# Patient Record
Sex: Female | Born: 1995
Health system: Southern US, Community
[De-identification: ages and names within clinical notes are randomized; demographics above are authoritative.]

## PROBLEM LIST (undated history)

## (undated) DIAGNOSIS — F909 Attention-deficit hyperactivity disorder, unspecified type: Secondary | ICD-10-CM

## (undated) DIAGNOSIS — K219 Gastro-esophageal reflux disease without esophagitis: Secondary | ICD-10-CM

## (undated) DIAGNOSIS — Z8619 Personal history of other infectious and parasitic diseases: Secondary | ICD-10-CM

## (undated) HISTORY — PX: MYRINGOTOMY WITH TUBE PLACEMENT: SHX5663

## (undated) HISTORY — DX: Personal history of other infectious and parasitic diseases: Z86.19

## (undated) HISTORY — PX: NO PAST SURGERIES: SHX2092

---
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
## 2014-01-26 ENCOUNTER — Encounter: Payer: Self-pay | Admitting: Internal Medicine

## 2014-01-26 ENCOUNTER — Ambulatory Visit (INDEPENDENT_AMBULATORY_CARE_PROVIDER_SITE_OTHER): Payer: 59 | Admitting: Internal Medicine

## 2014-01-26 ENCOUNTER — Encounter: Payer: Self-pay | Admitting: *Deleted

## 2014-01-26 ENCOUNTER — Other Ambulatory Visit (INDEPENDENT_AMBULATORY_CARE_PROVIDER_SITE_OTHER): Payer: 59

## 2014-01-26 VITALS — BP 112/62 | HR 91 | Temp 98.6°F | Ht 63.0 in | Wt 174.0 lb

## 2014-01-26 DIAGNOSIS — Z0000 Encounter for general adult medical examination without abnormal findings: Secondary | ICD-10-CM

## 2014-01-26 DIAGNOSIS — F988 Other specified behavioral and emotional disorders with onset usually occurring in childhood and adolescence: Secondary | ICD-10-CM

## 2014-01-26 DIAGNOSIS — E669 Obesity, unspecified: Secondary | ICD-10-CM

## 2014-01-26 LAB — CBC WITH DIFFERENTIAL/PLATELET
BASOS PCT: 0.3 % (ref 0.0–3.0)
Basophils Absolute: 0 10*3/uL (ref 0.0–0.1)
EOS ABS: 0 10*3/uL (ref 0.0–0.7)
EOS PCT: 0.6 % (ref 0.0–5.0)
HCT: 42 % (ref 36.0–49.0)
HEMOGLOBIN: 14.2 g/dL (ref 12.0–16.0)
LYMPHS PCT: 27.6 % (ref 24.0–48.0)
Lymphs Abs: 2.2 10*3/uL (ref 0.7–4.0)
MCHC: 33.8 g/dL (ref 31.0–37.0)
MCV: 86.9 fl (ref 78.0–98.0)
MONOS PCT: 7.6 % (ref 3.0–12.0)
Monocytes Absolute: 0.6 10*3/uL (ref 0.1–1.0)
NEUTROS ABS: 5.2 10*3/uL (ref 1.4–7.7)
Neutrophils Relative %: 63.9 % (ref 43.0–71.0)
Platelets: 309 10*3/uL (ref 150.0–575.0)
RBC: 4.83 Mil/uL (ref 3.80–5.70)
RDW: 12.8 % (ref 11.4–15.5)
WBC: 8.1 10*3/uL (ref 4.5–13.5)

## 2014-01-26 LAB — LIPID PANEL
CHOL/HDL RATIO: 3
Cholesterol: 169 mg/dL (ref 0–200)
HDL: 62 mg/dL (ref >39.00)
LDL Cholesterol: 92 mg/dL (ref 0–99)
NONHDL: 107
Triglycerides: 73 mg/dL (ref 0.0–149.0)
VLDL: 14.6 mg/dL (ref 0.0–40.0)

## 2014-01-26 LAB — BASIC METABOLIC PANEL
BUN: 15 mg/dL (ref 6–23)
CALCIUM: 9.6 mg/dL (ref 8.4–10.5)
CO2: 26 meq/L (ref 19–32)
Chloride: 103 mEq/L (ref 96–112)
Creatinine, Ser: 0.7 mg/dL (ref 0.4–1.2)
GFR: 110.82 mL/min (ref >60.00)
Glucose, Bld: 94 mg/dL (ref 70–99)
Potassium: 4.4 mEq/L (ref 3.5–5.1)
SODIUM: 137 meq/L (ref 135–145)

## 2014-01-26 LAB — TSH: TSH: 2.02 u[IU]/mL (ref 0.40–5.00)

## 2014-01-26 LAB — HEPATIC FUNCTION PANEL
ALBUMIN: 4.2 g/dL (ref 3.5–5.2)
ALK PHOS: 47 U/L (ref 47–119)
ALT: 15 U/L (ref 0–35)
AST: 21 U/L (ref 0–37)
BILIRUBIN DIRECT: 0 mg/dL (ref 0.0–0.3)
TOTAL PROTEIN: 7.5 g/dL (ref 6.0–8.3)
Total Bilirubin: 0.5 mg/dL (ref 0.2–1.2)

## 2014-01-26 MED ORDER — METHYLPHENIDATE HCL 10 MG PO TABS: 15.0000 mg | ORAL_TABLET | ORAL | Status: DC

## 2014-01-26 MED ORDER — METHYLPHENIDATE HCL ER (OSM) 36 MG PO TBCR: 72.0000 mg | EXTENDED_RELEASE_TABLET | ORAL | Status: DC

## 2014-01-26 NOTE — Patient Instructions (Addendum)
It was good to see you today.  Health Maintenance reviewed - all recommended immunizations and age-appropriate screenings are up-to-date.  We have reviewed your prior records including labs and tests today  Test(s) ordered today. Your results will be released to [facility] (or called to you) after review, usually within 72hours after test completion. If any changes need to be made, you will be notified at that same time.  Medications reviewed and updated, no changes recommended at this time. Refill on medication(s) as discussed today.  Work on lifestyle changes as discussed (low fat, low carb, increased protein diet; improved exercise efforts; weight loss) to control sugar, blood pressure and cholesterol levels and/or reduce risk of developing other medical problems. Look into [email or web address].[email or web address] or other type of food journal to assist you in this process.  Please schedule followup in 12 months, call sooner if problems.   Health Maintenance, Female A healthy lifestyle and preventative care can promote health and wellness.  Maintain regular health, dental, and eye exams.  Eat a healthy diet. Foods like vegetables, fruits, whole grains, low-fat dairy products, and lean protein foods contain the nutrients you need without too many calories. Decrease your intake of foods high in solid fats, added sugars, and salt. Get information about a proper diet from your caregiver, if necessary.  Regular physical exercise is one of the most important things you can do for your health. Most adults should get at least 150 minutes of moderate-intensity exercise (any activity that increases your heart rate and causes you to sweat) each week. In addition, most adults need muscle-strengthening exercises on 2 or more days a week.   Maintain a healthy weight. The body mass index (BMI) is a screening tool to identify possible weight problems. It provides an estimate of body fat based on height and weight. Your caregiver  can help determine your BMI, and can help you achieve or maintain a healthy weight. For adults 20 years and older:  A BMI below 18.5 is considered underweight.  A BMI of 18.5 to 24.9 is normal.  A BMI of 25 to 29.9 is considered overweight.  A BMI of 30 and above is considered obese.  Maintain normal blood lipids and cholesterol by exercising and minimizing your intake of saturated fat. Eat a balanced diet with plenty of fruits and vegetables. Blood tests for lipids and cholesterol should begin at age 4 and be repeated every 5 years. If your lipid or cholesterol levels are high, you are over 50, or you are a high risk for heart disease, you may need your cholesterol levels checked more frequently.Ongoing high lipid and cholesterol levels should be treated with medicines if diet and exercise are not effective.  If you smoke, find out from your caregiver how to quit. If you do not use tobacco, do not start.  Lung cancer screening is recommended for adults aged 49 80 years who are at high risk for developing lung cancer because of a history of smoking. Yearly low-dose computed tomography (CT) is recommended for people who have at least a 30-pack-year history of smoking and are a current smoker or have quit within the past 15 years. A pack year of smoking is smoking an average of 1 pack of cigarettes a day for 1 year (for example: 1 pack a day for 30 years or 2 packs a day for 15 years). Yearly screening should continue until the smoker has stopped smoking for at least 15 years. Yearly screening should also be stopped for people  who develop a health problem that would prevent them from having lung cancer treatment.  If you are pregnant, do not drink alcohol. If you are breastfeeding, be very cautious about drinking alcohol. If you are not pregnant and choose to drink alcohol, do not exceed 1 drink per day. One drink is considered to be 12 ounces (355 mL) of beer, 5 ounces (148 mL) of wine, or 1.5  ounces (44 mL) of liquor.  Avoid use of street drugs. Do not share needles with anyone. Ask for help if you need support or instructions about stopping the use of drugs.  High blood pressure causes heart disease and increases the risk of stroke. Blood pressure should be checked at least every 1 to 2 years. Ongoing high blood pressure should be treated with medicines, if weight loss and exercise are not effective.  If you are 73 to 19 years old, ask your caregiver if you should take aspirin to prevent strokes.  Diabetes screening involves taking a blood sample to check your fasting blood sugar level. This should be done once every 3 years, after age 74, if you are within normal weight and without risk factors for diabetes. Testing should be considered at a younger age or be carried out more frequently if you are overweight and have at least 1 risk factor for diabetes.  Breast cancer screening is essential preventative care for women. You should practice "breast self-awareness." This means understanding the normal appearance and feel of your breasts and may include breast self-examination. Any changes detected, no matter how small, should be reported to a caregiver. Women in their 42s and 30s should have a clinical breast exam (CBE) by a caregiver as part of a regular health exam every 1 to 3 years. After age 28, women should have a CBE every year. Starting at age 11, women should consider having a mammogram (breast X-ray) every year. Women who have a family history of breast cancer should talk to their caregiver about genetic screening. Women at a high risk of breast cancer should talk to their caregiver about having an MRI and a mammogram every year.  Breast cancer gene (BRCA)-related cancer risk assessment is recommended for women who have family members with BRCA-related cancers. BRCA-related cancers include breast, ovarian, tubal, and peritoneal cancers. Having family members with these cancers may be  associated with an increased risk for harmful changes (mutations) in the breast cancer genes BRCA1 and BRCA2. Results of the assessment will determine the need for genetic counseling and BRCA1 and BRCA2 testing.  The Pap test is a screening test for cervical cancer. Women should have a Pap test starting at age 108. Between ages 81 and 46, Pap tests should be repeated every 2 years. Beginning at age 90, you should have a Pap test every 3 years as long as the past 3 Pap tests have been normal. If you had a hysterectomy for a problem that was not cancer or a condition that could lead to cancer, then you no longer need Pap tests. If you are between ages 38 and 21, and you have had normal Pap tests going back 10 years, you no longer need Pap tests. If you have had past treatment for cervical cancer or a condition that could lead to cancer, you need Pap tests and screening for cancer for at least 20 years after your treatment. If Pap tests have been discontinued, risk factors (such as a new sexual partner) need to be reassessed to determine if screening  should be resumed. Some women have medical problems that increase the chance of getting cervical cancer. In these cases, your caregiver may recommend more frequent screening and Pap tests.  The human papillomavirus (HPV) test is an additional test that may be used for cervical cancer screening. The HPV test looks for the virus that can cause the cell changes on the cervix. The cells collected during the Pap test can be tested for HPV. The HPV test could be used to screen women aged 78 years and older, and should be used in women of any age who have unclear Pap test results. After the age of 8, women should have HPV testing at the same frequency as a Pap test.  Colorectal cancer can be detected and often prevented. Most routine colorectal cancer screening begins at the age of 18 and continues through age 78. However, your caregiver may recommend screening at an  earlier age if you have risk factors for colon cancer. On a yearly basis, your caregiver may provide home test kits to check for hidden blood in the stool. Use of a small camera at the end of a tube, to directly examine the colon (sigmoidoscopy or colonoscopy), can detect the earliest forms of colorectal cancer. Talk to your caregiver about this at age 14, when routine screening begins. Direct examination of the colon should be repeated every 5 to 10 years through age 67, unless early forms of pre-cancerous polyps or small growths are found.  Hepatitis C blood testing is recommended for all people born from [birth year] through 1966 and any individual with known risks for hepatitis C.  Practice safe sex. Use condoms and avoid high-risk sexual practices to reduce the spread of sexually transmitted infections (STIs). Sexually active women aged 51 and younger should be checked for Chlamydia, which is a common sexually transmitted infection. Older women with new or multiple partners should also be tested for Chlamydia. Testing for other STIs is recommended if you are sexually active and at increased risk.  Osteoporosis is a disease in which the bones lose minerals and strength with aging. This can result in serious bone fractures. The risk of osteoporosis can be identified using a bone density scan. Women ages 42 and over and women at risk for fractures or osteoporosis should discuss screening with their caregivers. Ask your caregiver whether you should be taking a calcium supplement or vitamin D to reduce the rate of osteoporosis.  Menopause can be associated with physical symptoms and risks. Hormone replacement therapy is available to decrease symptoms and risks. You should talk to your caregiver about whether hormone replacement therapy is right for you.  Use sunscreen. Apply sunscreen liberally and repeatedly throughout the day. You should seek shade when your shadow is shorter than you. Protect yourself by  wearing long sleeves, pants, a wide-brimmed hat, and sunglasses year round, whenever you are outdoors.  Notify your caregiver of new moles or changes in moles, especially if there is a change in shape or color. Also notify your caregiver if a mole is larger than the size of a pencil eraser.  Stay current with your immunizations. Document Released: 02/19/2011 Document Revised: 12/01/2012 Document Reviewed: 02/19/2011 [facility] Patient Information 2014 [place]. Exercise to Lose Weight Exercise and a healthy diet may help you lose weight. Your doctor may suggest specific exercises. EXERCISE IDEAS AND TIPS  Choose low-cost things you enjoy doing, such as walking, bicycling, or exercising to workout videos.  Take stairs instead of the elevator.  Walk during  your lunch break.  Park your car further away from work or school.  Go to a gym or an exercise class.  Start with 5 to 10 minutes of exercise each day. Build up to 30 minutes of exercise 4 to 6 days a week.  Wear shoes with good support and comfortable clothes.  Stretch before and after working out.  Work out until you breathe harder and your heart beats faster.  Drink extra water when you exercise.  Do not do so much that you hurt yourself, feel dizzy, or get very short of breath. Exercises that burn about 150 calories:  Running 1  miles in 15 minutes.  Playing volleyball for 45 to 60 minutes.  Washing and waxing a car for 45 to 60 minutes.  Playing touch football for 45 minutes.  Walking 1  miles in 35 minutes.  Pushing a stroller 1  miles in 30 minutes.  Playing basketball for 30 minutes.  Raking leaves for 30 minutes.  Bicycling 5 miles in 30 minutes.  Walking 2 miles in 30 minutes.  Dancing for 30 minutes.  Shoveling snow for 15 minutes.  Swimming laps for 20 minutes.  Walking up stairs for 15 minutes.  Bicycling 4 miles in 15 minutes.  Gardening for 30 to 45 minutes.  Jumping rope for 15  minutes.  Washing windows or floors for 45 to 60 minutes. Document Released: 09/08/2010 Document Revised: 10/29/2011 Document Reviewed: 09/08/2010 [facility] Patient Information 2014 [place], [place].

## 2014-01-26 NOTE — Progress Notes (Signed)
Subjective:    Patient ID: Sandra ColeMadison N Dawson, female    DOB: 1996/03/23, 18 y.o.   MRN: [id number]  HPI  patient is here today for annual physical. Patient feels well and has no complaints.  Also reviewed chronic medical issues and interval medical events  Past Medical History  Diagnosis Date  . ADHD (attention deficit hyperactivity disorder)   . History of chicken pox    Family History  Problem Relation Age of Onset  . Cancer Other     family hx colon & lung cancer  . Hyperlipidemia Father    History  Substance Use Topics  . Smoking status: Never Smoker   . Smokeless tobacco: Not on file  . Alcohol Use: No    Review of Systems  Constitutional: Positive for unexpected weight change (>20# in past 11 mo (freshman year)). Negative for fatigue.  Respiratory: Negative for cough, shortness of breath and wheezing.   Cardiovascular: Negative for chest pain, palpitations and leg swelling.  Gastrointestinal: Negative for nausea, abdominal pain and diarrhea.  Neurological: Negative for dizziness, weakness, light-headedness and headaches.  Psychiatric/Behavioral: Negative for dysphoric mood. The patient is not nervous/anxious.   All other systems reviewed and are negative.      Objective:   Physical Exam  BP 112/62  Pulse 91  Temp(Src) 98.6 F (37 C) (Oral)  Ht 5\' 3"  (1.6 m)  Wt 174 lb (78.926 kg)  BMI 30.83 kg/m2  SpO2 98% Wt Readings from Last 3 Encounters:  01/26/14 174 lb (78.926 kg) (93%*, Z = 1.51)  03/17/13 152 lb 6.4 oz (69.128 kg) (85%*, Z = 1.06)   * Growth percentiles are based on CDC 2-20 Years data.   Constitutional: She is obese, appears well-developed and well-nourished. No distress. sister at side HENT: Head: Normocephalic and atraumatic. Ears: B TMs ok, no erythema or effusion; Nose: Nose normal. Mouth/Throat: Oropharynx is clear and moist. No oropharyngeal exudate.  Eyes: Conjunctivae and EOM are normal. Pupils are equal, round, and reactive to light.  No scleral icterus.  Neck: Normal range of motion. Neck supple. No JVD present. No thyromegaly present.  Cardiovascular: Normal rate, regular rhythm and normal heart sounds.  No murmur heard. No BLE edema. Pulmonary/Chest: Effort normal and breath sounds normal. No respiratory distress. She has no wheezes.  Abdominal: Soft. Bowel sounds are normal. She exhibits no distension. There is no tenderness. no masses GU: defer Musculoskeletal: Normal range of motion, no joint effusions. No gross deformities Neurological: She is alert and oriented to person, place, and time. No cranial nerve deficit. Coordination, balance, strength, speech and gait are normal.  Skin: Skin is warm and dry. No rash noted. No erythema.  Psychiatric: She has a normal mood and affect. Her behavior is normal. Judgment and thought content normal.    No results found for this basename: WBC,  HGB,  HCT,  PLT,  GLUCOSE,  CHOL,  TRIG,  HDL,  LDLDIRECT,  LDLCALC,  ALT,  AST,  NA,  K,  CL,  CREATININE,  BUN,  CO2,  TSH,  PSA,  INR,  GLUF,  HGBA1C,  MICROALBUR    Dg Chest 2 View  05/19/2012   *RADIOLOGY REPORT*  Clinical Data: Fever, cough, wheezing.  CHEST - 2 VIEW  Comparison: None.  Findings: There is a consolidative pneumonia in the posterior aspect of the right lower lobe.  Left lung is clear.  Heart size and vascularity are normal.  No effusions.  No osseous abnormality.  IMPRESSION: Right lower lobe  pneumonia.   Original Report Authenticated By: Gwynn Burly, M.D.       Assessment & Plan:   CPX/v70.0 - Patient has been counseled on age-appropriate routine health concerns for screening and prevention. These are reviewed and up-to-date. Immunizations are up-to-date or declined. Labs ordered and reviewed.  Problem List Items Addressed This Visit   ADD (attention deficit disorder)     On ritalin and concerta for years Stable, controlled meds to be managed by student health while at school in [place]  UDS per protocol  with [facility] today    Obese      Wt Readings from Last 3 Encounters:  01/26/14 174 lb (78.926 kg) (93%*, Z = 1.51)  03/17/13 152 lb 6.4 oz (69.128 kg) (85%*, Z = 1.06)   * Growth percentiles are based on CDC 2-20 Years data.  weight trends reviewed Check labs to rule out metabolic issue The patient is asked to make an attempt to improve diet and exercise patterns to aid in medical management of this problem.     Relevant Medications      methylphenidate (RITALIN) tablet      methylphenidate (CONCERTA) CR tablet    Other Visit Diagnoses   Routine general medical examination at a health care facility    -  Primary    Relevant Orders       Basic metabolic panel       CBC with Differential       Hepatic function panel       Lipid panel       TSH

## 2014-01-26 NOTE — Assessment & Plan Note (Signed)
Wt Readings from Last 3 Encounters:  01/26/14 174 lb (78.926 kg) (93%*, Z = 1.51)  03/17/13 152 lb 6.4 oz (69.128 kg) (85%*, Z = 1.06)   * Growth percentiles are based on CDC 2-20 Years data.  weight trends reviewed Check labs to rule out metabolic issue The patient is asked to make an attempt to improve diet and exercise patterns to aid in medical management of this problem.

## 2014-01-26 NOTE — Assessment & Plan Note (Signed)
On ritalin and concerta for years Stable, controlled meds to be managed by student health while at school in [place]  UDS per protocol with [facility] today

## 2014-01-26 NOTE — Progress Notes (Signed)
Pre visit review using our clinic review tool, if applicable. No additional management support is needed unless otherwise documented below in the visit note. 

## 2014-02-03 ENCOUNTER — Encounter: Payer: Self-pay | Admitting: Internal Medicine

## 2014-02-07 ENCOUNTER — Encounter: Payer: Self-pay | Admitting: Internal Medicine

## 2014-02-07 DIAGNOSIS — R635 Abnormal weight gain: Secondary | ICD-10-CM

## 2014-02-07 DIAGNOSIS — E669 Obesity, unspecified: Secondary | ICD-10-CM

## 2014-02-23 NOTE — Addendum Note (Signed)
Addended by: Rene PaciLESCHBER, Baylen Dea A on: 02/23/2014 11:06 AM   Modules accepted: Orders

## 2014-03-12 ENCOUNTER — Ambulatory Visit (INDEPENDENT_AMBULATORY_CARE_PROVIDER_SITE_OTHER): Payer: 59 | Admitting: Internal Medicine

## 2014-03-12 ENCOUNTER — Encounter: Payer: Self-pay | Admitting: Internal Medicine

## 2014-03-12 VITALS — BP 102/82 | HR 72 | Temp 98.6°F | Resp 16 | Wt 173.0 lb

## 2014-03-12 DIAGNOSIS — J019 Acute sinusitis, unspecified: Secondary | ICD-10-CM | POA: Insufficient documentation

## 2014-03-12 DIAGNOSIS — J018 Other acute sinusitis: Secondary | ICD-10-CM

## 2014-03-12 MED ORDER — AZITHROMYCIN 250 MG PO TABS: ORAL_TABLET | ORAL | Status: DC

## 2014-03-12 NOTE — Progress Notes (Signed)
Pre visit review using our clinic review tool, if applicable. No additional management support is needed unless otherwise documented below in the visit note. 

## 2014-03-12 NOTE — Progress Notes (Signed)
   Subjective:     URI  This is a new problem. The current episode started in the past 7 days. The problem has been gradually worsening. The maximum temperature recorded prior to her arrival was 100 - 100.9 F. Associated symptoms include congestion and coughing. She has tried acetaminophen and decongestant for the symptoms. The treatment provided no relief.      Review of Systems  HENT: Positive for congestion.   Respiratory: Positive for cough.   purulent mucus     Objective:   Physical Exam  Constitutional: No distress.  HENT:  Mouth/Throat: No oropharyngeal exudate.  eryth throat  Eyes: Right eye exhibits no discharge.  Neck: No thyromegaly present.  Cardiovascular: Normal rate.   Pulmonary/Chest: She has no wheezes. She has no rales. She exhibits no tenderness.          Assessment & Plan:

## 2014-03-12 NOTE — Assessment & Plan Note (Signed)
Zpac OTC meds 

## 2014-03-16 ENCOUNTER — Ambulatory Visit (INDEPENDENT_AMBULATORY_CARE_PROVIDER_SITE_OTHER): Payer: 59 | Admitting: Internal Medicine

## 2014-03-16 ENCOUNTER — Encounter: Payer: Self-pay | Admitting: Internal Medicine

## 2014-03-16 VITALS — BP 110/80 | HR 87 | Temp 98.8°F | Wt 177.0 lb

## 2014-03-16 DIAGNOSIS — R059 Cough, unspecified: Secondary | ICD-10-CM

## 2014-03-16 DIAGNOSIS — R05 Cough: Secondary | ICD-10-CM

## 2014-03-16 DIAGNOSIS — J018 Other acute sinusitis: Secondary | ICD-10-CM

## 2014-03-16 MED ORDER — BENZONATATE 200 MG PO CAPS: 200.0000 mg | ORAL_CAPSULE | Freq: Three times a day (TID) | ORAL | Status: DC | PRN

## 2014-03-16 MED ORDER — AMOXICILLIN 500 MG PO CAPS: 500.0000 mg | ORAL_CAPSULE | Freq: Three times a day (TID) | ORAL | Status: DC

## 2014-03-16 NOTE — Progress Notes (Signed)
   Subjective:    Patient ID: Sandra Dawson, female    DOB: Feb 04, 1996, 18 y.o.   MRN: [id number]  HPI Pt's symptoms began 03/09/14 as cough and congestion. She was seen in clinic 03/12/14 and given a Zpack for acute sinusitis. Today is her last day of the Zpack.  Pt continues to cough and have nasal and head congestion. Cough is nonproductive. There is some SOB associated. The cough is constant throughout the day. The patient has noticed PND and a sore throat. She has constant dull headache at the base of occipitis. She reports a Tmax of 100.6 since she has been on the Zpack. The pt thinks her symptoms may have improved over the weekend after starting the Zpack bt states that today all of her symptoms are still present and have worsened. She has also tried mucinex, cough drops and nasal decongestants with little relief.   Her boyfriend had a URI recently. She has no allergies.   Review of Systems  HENT: Negative for ear discharge, ear pain, rhinorrhea, sinus pressure and sneezing.   Eyes: Negative for itching.  Respiratory: Negative for wheezing.   Allergic/Immunologic: Negative for environmental allergies.       Objective:   Physical Exam        Assessment & Plan:

## 2014-03-16 NOTE — Progress Notes (Signed)
Pre visit review using our clinic review tool, if applicable. No additional management support is needed unless otherwise documented below in the visit note. 

## 2014-03-16 NOTE — Patient Instructions (Signed)
Plain Mucinex (NOT D) for thick secretions ;force NON dairy fluids .   Nasal cleansing in the shower as discussed with lather of mild shampoo.After 10 seconds wash off lather while  exhaling through nostrils. Make sure that all residual soap is removed to prevent irritation.  Flonase OR Nasacort AQ 1 spray in each nostril twice a day as needed. Use the "crossover" technique into opposite nostril spraying toward opposite ear @ 45 degree angle, not straight up into nostril.  Use a Neti pot daily only  as needed for significant sinus congestion; going from open side to congested side . Plain Allegra (NOT D )  160 daily , Loratidine 10 mg , OR Zyrtec 10 mg @ bedtime  as needed for itchy eyes & sneezing. If loose stools occur please take a probiotic , Florastor OR Align, every day until the bowels are normal. This will replace the normal bacteria which  are necessary for formation of normal stool and processing of food.

## 2014-03-16 NOTE — Progress Notes (Signed)
   Subjective:    Patient ID: Sandra Dawson, female    DOB: 11-23-1995, 18 y.o.   MRN: [id number]  HPI   Symptoms began 03/09/14 as cough and head congestion. She was given a Z-Pak for acute sinusitis. Today is last day of that prescription.  She continues to cough and have nasal congestion with postnasal drainage. The cough is nonproductive and associated with some shortness of breath. The cough is constant throughout the day. She has noted a sore throat in  context of postnasal drainage.  She's had some headache at the base of the occiput.  On the Z-Pak she did have a temp maximum of 100.6.  She feels that her symptoms may have improved some but the upper respiratory tract symptoms are still present. She does use Mucinex, cough drops, nasal decongestants with little relief.  Her boyfriend had an upper respiratory tract infection recently    Review of Systems Frontal headache, facial pain , nasal purulence, dental pain,  otic pain or otic discharge denied @ present. No fever , chills or sweats.     Objective:   Physical Exam General appearance:good health ;well nourished; no acute distress or increased work of breathing is present.  No  lymphadenopathy about the head, neck, or axilla noted.   Eyes: No conjunctival inflammation or lid edema is present.   Ears:  External ear exam shows no significant lesions or deformities.  Otoscopic examination reveals clear canals, tympanic membranes are intact bilaterally without bulging, retraction, inflammation or discharge.  Nose:  External nasal examination shows no deformity or inflammation. Nasal mucosa are pink and moist without lesions or exudates. No septal dislocation or deviation.No obstruction to airflow.   Oral exam: Dental hygiene is good; lips and gums are healthy appearing.There is no oropharyngeal erythema or exudate noted.   Neck:  No deformities, thyromegaly, masses, or tenderness noted.     Heart:  Normal rate and regular  rhythm. S1 and S2 normal without gallop, murmur, click, rub or other extra sounds.   Lungs:Chest clear to auscultation; no wheezes, rhonchi,rales ,or rubs present.No increased work of breathing.    Extremities:  No cyanosis, edema, or clubbing  noted    Skin: Warm & dry          Assessment & Plan:  #1 rhinosinusitis without significant bronchitis  Plan: Nasal hygiene interventions discussed. See prescription medications

## 2014-03-22 ENCOUNTER — Ambulatory Visit: Payer: 59 | Admitting: Internal Medicine

## 2014-04-01 ENCOUNTER — Encounter: Payer: Self-pay | Admitting: *Deleted

## 2014-04-01 ENCOUNTER — Encounter: Payer: 59 | Attending: Internal Medicine | Admitting: *Deleted

## 2014-04-01 DIAGNOSIS — Z6831 Body mass index (BMI) 31.0-31.9, adult: Secondary | ICD-10-CM | POA: Diagnosis not present

## 2014-04-01 DIAGNOSIS — Z713 Dietary counseling and surveillance: Secondary | ICD-10-CM | POA: Insufficient documentation

## 2014-04-01 DIAGNOSIS — E669 Obesity, unspecified: Secondary | ICD-10-CM | POA: Diagnosis present

## 2014-04-01 NOTE — Patient Instructions (Signed)
Goals:  Put away the scale.  Refrain from weighing self between nutrition appointments Reject diet mentality- there are no good or bad foods Listen to internal hunger cues and honor those cues; don't wait until you're ravenous to eat Choose the food(s) you want Enjoy those foods.  Try to make meal last 20 minutes Stop eating when full.  Honor fullness cues too Do these things without any guilt or regret  Aim to do physical activity that is fun!

## 2014-04-01 NOTE — Progress Notes (Signed)
Medical Nutrition Therapy:  Appt start time: 1100 end time:  1200.  Assessment:  Primary concerns today: Sandra Dawson is here with her mom for nutrition counseling pertaining to abnormal weight gain this past year.  Sandra Dawson gained over 20 pounds her freshman year of college at [employer].  She has been doing Insanity this summer .  Started birth control pills last September, prior to that her cycles were irregular.   Sometimes eats while distracted and admits to being a fast eater.     Preferred Learning Style:  Auditory  Visual   Learning Readiness:  Change in progress   MEDICATIONS: see list   DIETARY INTAKE:  Usual eating pattern includes 3 meals and 1-2 snacks per day.  Everyday foods include proteins, starches, fruit, sometimes vegetables.  Avoided foods include dairy (Insanity program restricts dairy).    24-hr recall:  B ( AM): 1/2 peanut butter sandwich on whole wheat bread.   Snk ( AM): not usually  L ( PM): leftovers: Sandra Dawson yesterday- chicken kebab with carrots  Snk ( PM): fruit D ( PM): fruit and salmon Snk ( PM): mini vanilla waffers Beverages: water  Usual physical activity: Insanity for 2 months straight until about a month ago  B: Kind bar or [employer] S: welsh's fruit snacks or another bar L: skips D: 1-3 plates of whatever is in cafeteria Ate a lot of fruit and tried to cut back on dessert S: peanut butter Beverages: sodas  Usually physical activity: walking across college campus.  Did 30 day challenges  Estimated energy needs: 1600-1800 calories 180-200 g carbohydrates 120-135 g protein 44-50 g fat    Nutritional Diagnosis:  Sandra Dawson-3.4 Unintentional weight gain As related to disordered eating pattern at college combined with limited activity and poor food choices.  As evidenced by 25 pound weight gain in 1 year.    Intervention:  Nutrition counseling provided.  Encouraged patient to reject traditional diet mentality of "good" vs "bad" foods.  There are no  good and bad foods, but rather food is fuel that we needs for our bodies.  When we don't get enough fuel, our bodies suffer the metabolic consequences. Encouraged patient to honor their body's internal hunger and fullness cues.  Throughout the day, check in mentally and rate hunger.  Try not to eat when ravenous, but instead when slightly hungry.  Then choose food(s) that will be satisfying regardless of nutritional content.  Sit down to enjoy those foods.  Minimize distractions: turn off tv, put away books, work, Programmer, applicationsmagazine,etc.  Make the meal last at least 20 minutes in order to give time to experience and register satiety.  Stop eating when full regardless of how much food is left on the plate.  Get more if still hungry.  The key is to honor fullness so throughout the meal, rate fullness factor and stop when comfortably full, but not stuffed.  Reminded patient that they can have any food they want, whenever they want, and however much they want.  Eventually the novelty will wear out and each food will be equal in terms of its emotional appeal.  This will be a learning process and some days more food will be eaten, some days less.  The key is to honor hunger and fullness without any feelings of guilt.  Pay attention to what the internal cues are, rather than any external factors.  Discussed MyPlate recommendations for meal planning: strive to eat from all food groups: dairy, starch, protein, fruits, and vegetables. Discussed navigating  the cafeteria and having healthy snacks available in her dorm room  Discussed importance of regular physical activity that is enjoyable, not exhausting.    Teaching Method Utilized:  Auditory   Handouts given during visit include:  Snack guide  Barriers to learning/adherence to lifestyle change: busy schedule of a college student  Demonstrated degree of understanding via:  Teach Back   Monitoring/Evaluation:  Dietary intake, exercise, and body weight in several  month(s).

## 2014-04-02 ENCOUNTER — Ambulatory Visit: Payer: 59 | Admitting: *Deleted

## 2014-04-12 ENCOUNTER — Telehealth: Payer: Self-pay

## 2014-04-12 ENCOUNTER — Ambulatory Visit: Payer: 59 | Admitting: *Deleted

## 2014-04-12 MED ORDER — CLINDAMYCIN PHOSPHATE 1 % EX GEL: Freq: Two times a day (BID) | CUTANEOUS | Status: DC

## 2014-04-12 NOTE — Addendum Note (Signed)
Addended by: Rene Paci A on: 04/12/2014 12:17 PM   Modules accepted: Orders

## 2014-04-12 NOTE — Telephone Encounter (Signed)
Ok to fill - erx done by me thanks

## 2014-05-03 ENCOUNTER — Telehealth: Payer: Self-pay | Admitting: Internal Medicine

## 2014-05-03 NOTE — Telephone Encounter (Signed)
Pt's mother called request refill for Concerta for 3 month supply. Please call Britta Mccreedy if this is ready.

## 2014-05-04 NOTE — Telephone Encounter (Signed)
This is the CR tab of methylphenidate -  tabs, 2 tabs qd, last refilled  #180 on 01/2014 Ok to have someone sign for refill in my absence if unable to wait until Mon thanks

## 2014-05-05 ENCOUNTER — Other Ambulatory Visit: Payer: Self-pay

## 2014-05-05 MED ORDER — METHYLPHENIDATE HCL ER (OSM) 36 MG PO TBCR: 72.0000 mg | EXTENDED_RELEASE_TABLET | ORAL | Status: DC

## 2014-05-05 NOTE — Telephone Encounter (Signed)
LVM that rx is ready for pick up here at the office.  

## 2014-07-12 ENCOUNTER — Other Ambulatory Visit: Payer: Self-pay

## 2014-07-12 MED ORDER — LEVONORGESTREL-ETHINYL ESTRAD 0.1-20 MG-MCG PO TABS: 1.0000 | ORAL_TABLET | Freq: Every day | ORAL | Status: DC

## 2014-08-05 ENCOUNTER — Ambulatory Visit: Payer: 59 | Admitting: *Deleted

## 2014-08-06 ENCOUNTER — Ambulatory Visit (INDEPENDENT_AMBULATORY_CARE_PROVIDER_SITE_OTHER): Payer: 59 | Admitting: Internal Medicine

## 2014-08-06 VITALS — BP 110/70 | HR 98 | Temp 97.8°F | Wt 174.5 lb

## 2014-08-06 DIAGNOSIS — F988 Other specified behavioral and emotional disorders with onset usually occurring in childhood and adolescence: Secondary | ICD-10-CM

## 2014-08-06 DIAGNOSIS — F909 Attention-deficit hyperactivity disorder, unspecified type: Secondary | ICD-10-CM

## 2014-08-06 DIAGNOSIS — F959 Tic disorder, unspecified: Secondary | ICD-10-CM

## 2014-08-06 MED ORDER — METHYLPHENIDATE HCL ER (OSM) 36 MG PO TBCR: 72.0000 mg | EXTENDED_RELEASE_TABLET | ORAL | Status: DC

## 2014-08-06 NOTE — Patient Instructions (Signed)
To prevent facial tics, avoid stimulants such as decongestants, diet pills, nicotine, or caffeine (coffee, tea, cola, or chocolate) to excess. Use a coal tar shampoo such as T-gel one to 3 times per week to control any scalp dermatiis

## 2014-08-06 NOTE — Progress Notes (Signed)
   Subjective:    Patient ID: Sandra Dawson, female    DOB: 1996-01-06, 18 y.o.   MRN: [id number]  HPI  She's had a diagnosis of ADD since age 18. She was officially tested. I,ve asked her to provide verification of the exact testing date and results    She has been compliant with medications which are effective.   She has had no adverse effects from the medication.  She has developed a facial tic recently with onset of her exams. There is also some dorms stress.   These stresses have also resulted in decreased sleep prior to onset of tics.   The only stimulants she takes is a cup of soda every other day. She does not drink alcohol or smoke.  Review of Systems     No weight gain / loss ,  sleep disruption, fatigue, weakness No heart racing or skipping, abnormal sweating No nausea or vomiting,loss of appetite,constipation, diarrhea No tremor, mental status change (hyperactive or lethargy),headache  No anxiety, depression, loss of interest, panic attacks, excess alcohol use No change in skin/hair/nails    She's had dermatitis of the scalp line anteriorly as well as posteriorly. The school health staff prescribed fluocinonide cream.     Objective:   Physical Exam Positive or pertinent findings include: She has slightly erythematous dry, scaly dermatitis at the anterior hairline.  She has minor unsustained lateral nystagmus She had a slight tachycardia during the exam.   Gen.:  well-nourished; in no acute distress Eyes: Extraocular motion intact; no lid lag or proptosis  Neck: full ROM; no masses ; thyroid normal  Heart: Normal rhythm without significant murmur, gallop, or extra heart sounds Lungs: Chest clear to auscultation without rales,rales, wheezes Neuro:Deep tendon reflexes are equal and within normal limits; no tremor  Skin: Warm and dry without significant lesions or rashes; no onycholysis Lymphatic: no cervical or axillary LA Psych: Normally communicative and  interactive; no abnormal mood or affect clinically but appears slightly anxious.         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

## 2014-08-06 NOTE — Progress Notes (Signed)
Pre visit review using our clinic review tool, if applicable. No additional management support is needed unless otherwise documented below in the visit note. 

## 2014-08-07 ENCOUNTER — Encounter: Payer: Self-pay | Admitting: Internal Medicine

## 2014-08-07 NOTE — Assessment & Plan Note (Signed)
No change in meds.

## 2014-08-24 ENCOUNTER — Ambulatory Visit: Payer: 59 | Admitting: *Deleted

## 2014-08-26 ENCOUNTER — Encounter: Payer: 59 | Attending: Internal Medicine | Admitting: *Deleted

## 2014-08-26 VITALS — Wt 174.0 lb

## 2014-08-26 DIAGNOSIS — Z713 Dietary counseling and surveillance: Secondary | ICD-10-CM | POA: Insufficient documentation

## 2014-08-26 DIAGNOSIS — Z68.41 Body mass index (BMI) pediatric, greater than or equal to 95th percentile for age: Secondary | ICD-10-CM | POA: Insufficient documentation

## 2014-08-26 DIAGNOSIS — E669 Obesity, unspecified: Secondary | ICD-10-CM | POA: Diagnosis not present

## 2014-08-26 NOTE — Progress Notes (Signed)
  Medical Nutrition Therapy:  Appt start time: 0800 end time:  0830.  Assessment:  Primary concerns today: Sandra Dawson is here for follow up nutrition counseling pertaining to abnormal weight gain this past year. Sandra Dawson gained over 20 pounds her freshman year of college at [employer].  I saw her this summer and since then, she has not gained any additional weight. She has been trying to make some changes while at school: She states she doesn't buy as many chips at school and she's working out more. Hasn't been ssnacking as much because she's honoring her hunger and fullness cues better.  She noticed she isn't buying as many snacks.  She's also not drinking as much soda (though she switched to capri sun).  She's also trying to eat more regular meals and isn't skipping meals.  She weighed herself at school towards the end of the semester and that scale read 168 lb.  However, she has been home for about a month over winter break and she's gained some of that weight back    Preferred Learning Style:  Auditory  Visual  Learning Readiness:  Change in progress   MEDICATIONS: see list   DIETARY INTAKE: Usual eating pattern includes 3 meals and 1-2 snacks per day.  Everyday foods include proteins, starches, fruit, sometimes vegetables.  Avoided foods include none  24-hr recall:  B:cereal or muffins L: hamburger S: wheat thins and capri sun D: pasta or chicken Beverages: water  Usually physical activity: weight training and cardio 3-4 times/week at school (since September for 1.5 hours).  Plans to go to to spin class when school starts back   Estimated energy needs: 1600-1800 calories 180-200 g carbohydrates 120-135 g protein 44-50 g fat    Nutritional Diagnosis:  [place]-3.4 Unintentional weight gain As related to disordered eating pattern at college combined with limited activity and poor food choices.  As evidenced by 25 pound weight gain in 1 year.    Intervention:  Nutrition counseling  provided.  Praised [employer] for the changes she implemented while at school.  Encouraged her to continue those changes this coming semester.  Focused on the positive that she has not gained any weight.   Educated her of the sugar content in Oak[place]ri Sun and suggested she limit this beverage.  Recommended keeping fruits, veggies, and yogurt in her dorm room fridge for snacking and brainstormed things she could have in the cafeteria  Teaching Method Utilized:  Auditory   Barriers to learning/adherence to lifestyle change: limited food selection in cafeteria  Demonstrated degree of understanding via:  Teach Back   Monitoring/Evaluation:  Dietary intake, exercise, and body weight in several month(s).

## 2014-10-13 ENCOUNTER — Other Ambulatory Visit: Payer: Self-pay

## 2014-10-13 MED ORDER — LEVONORGESTREL-ETHINYL ESTRAD 0.1-20 MG-MCG PO TABS: 1.0000 | ORAL_TABLET | Freq: Every day | ORAL | Status: DC

## 2014-11-01 ENCOUNTER — Other Ambulatory Visit: Payer: Self-pay | Admitting: Internal Medicine

## 2014-11-01 DIAGNOSIS — F988 Other specified behavioral and emotional disorders with onset usually occurring in childhood and adolescence: Secondary | ICD-10-CM

## 2014-11-01 NOTE — Telephone Encounter (Signed)
Sent to PCP for approval.  

## 2014-11-01 NOTE — Telephone Encounter (Signed)
Patient mother is requesting a refill of methylphenidate (CONCERTA) 36 MG CR tablet @ 3 refills.

## 2014-11-02 MED ORDER — METHYLPHENIDATE HCL ER (OSM) 36 MG PO TBCR: 72.0000 mg | EXTENDED_RELEASE_TABLET | ORAL | Status: DC

## 2014-11-02 NOTE — Telephone Encounter (Signed)
Ok to refill - Unclear if request is for 30d supply with 3 refills or 90d supply with 3 refills - Regardless, will approve rx for max 6 mo supply per regulations on controlled substances I have prepared 90d supply with 1 refill to sign thanks

## 2015-02-02 ENCOUNTER — Telehealth: Payer: Self-pay | Admitting: Internal Medicine

## 2015-02-02 NOTE — Telephone Encounter (Signed)
Patient is needing refill for methylphenidate 36 MG PO CR tablet [[id number]   (this should be Concerta). I have scheduled an appointment with her for 7/1 with Dr. Dorise Hiss regarding her medications.

## 2015-02-03 MED ORDER — METHYLPHENIDATE HCL ER (OSM) 36 MG PO TBCR: 72.0000 mg | EXTENDED_RELEASE_TABLET | ORAL | Status: DC

## 2015-02-03 NOTE — Telephone Encounter (Signed)
Will fill one month supply, please let patient know that I do not treat ADD long term and am happy to see her for Dr. Felicity Coyer in July.

## 2015-02-04 NOTE — Telephone Encounter (Signed)
Pt called back and was given MD response below.  Pt does not want to switch PCP.

## 2015-02-18 ENCOUNTER — Ambulatory Visit (INDEPENDENT_AMBULATORY_CARE_PROVIDER_SITE_OTHER): Payer: 59 | Admitting: Internal Medicine

## 2015-02-18 ENCOUNTER — Encounter: Payer: Self-pay | Admitting: Internal Medicine

## 2015-02-18 VITALS — BP 114/62 | HR 106 | Temp 98.3°F | Resp 12 | Ht 63.0 in | Wt 183.8 lb

## 2015-02-18 DIAGNOSIS — F909 Attention-deficit hyperactivity disorder, unspecified type: Secondary | ICD-10-CM

## 2015-02-18 DIAGNOSIS — F988 Other specified behavioral and emotional disorders with onset usually occurring in childhood and adolescence: Secondary | ICD-10-CM

## 2015-02-18 MED ORDER — METHYLPHENIDATE HCL ER (CD) 20 MG PO CPCR: 60.0000 mg | ORAL_CAPSULE | ORAL | Status: DC

## 2015-02-18 NOTE — Assessment & Plan Note (Addendum)
UDS collected today. She states that she takes the extra 1.5 ritalin in the night 4 times per week which is inconsistent with her fill history (medicine not filled since last June for #135 pills). Will not refill the ritalin today. Can try switching from concerta to metadate-cr which is a different XR formulation of the same medication. Talked to her about possibly taking the medicine later in the day as she does not have a need for focus in the morning when she is not working and not in school. Strongly recommended that they try the focus clinic for trying some behavioral strategies since she is maxing out of dosage of medication to the point of side effect and still not feeling like she gets relief. Filled 1 month supply of metadate-cr and recommend that she keep the appointment with her PCP as I have informed them that I do not have experience or expertise managing ADD/ADHD and do not manage or adjust medications typically. Spent 15-20 minutes counseling the patient on possible long term side effects of medications and unknown risk of heart disease and stroke from long term usage of stimulants.

## 2015-02-18 NOTE — Progress Notes (Signed)
Pre visit review using our clinic review tool, if applicable. No additional management support is needed unless otherwise documented below in the visit note. 

## 2015-02-18 NOTE — Patient Instructions (Signed)
We will change you to a medicine called metadate which is also a controlled release medicine like the one you were taking.   Start taking 1 pill per day for the first week. After the first week you can try 2 pills per day if you are still not controlled. After the 3rd week you can increase to 3 pills a day which is the maximum dose.   We would still like you to follow up with Dr. Felicity CoyerLeschber in August to talk to her about if it is working.   These are the two clinics that focus on ADD/ADHD in children and adults that I recommend. They can also work with you on behavioral strategies to help you focus without medication.   [email or web address]? [phone]  [address], [place], [place]  [facility] focus [facility]: [address], [place], [place] [address]  Phone: [phone]

## 2015-02-18 NOTE — Progress Notes (Signed)
   Subjective:    Patient ID: Sandra Dawson, female    DOB: 15-Jan-1996, 19 y.o.   MRN: [id number]  HPI The patient is a 19 YO female who is coming in for ADD follow up. Not doing well on current medication. Takes max dose concerta which lasts until 2-4PM. Works in the evening and takes ritalin prn in the evening so she can focus on work. Not in school right now but takes in the morning so she can do things at home. Feels also that she is getting a tic from the medicine. Had one before on vyvanse and this was why she was switched to concerta. This lack of focus has been going on for 6-7 months. Has never tried non-stimulants for her ADD. Diagnosed around age 20-11 and has been on increasing doses of stimulants since that time. Her mother is present and helps to provide history. Asked her if she is having more stress or depression in her life or other things that would distract her more and she is not sure and unable to answer that question.   Review of Systems  Constitutional: Negative.   Respiratory: Negative.   Cardiovascular: Negative.   Gastrointestinal: Negative.   Psychiatric/Behavioral: Positive for decreased concentration. Negative for suicidal ideas, sleep disturbance, self-injury, dysphoric mood and agitation. The patient is nervous/anxious and is hyperactive.       Objective:   Physical Exam  Constitutional: She is oriented to person, place, and time. She appears well-developed and well-nourished.  HENT:  Head: Normocephalic and atraumatic.  Eyes: EOM are normal.  Neck: Normal range of motion.  Cardiovascular: Normal rate and regular rhythm.   Pulmonary/Chest: Effort normal. No respiratory distress. She has no wheezes.  Abdominal: Soft. She exhibits no distension. There is no tenderness. There is no rebound.  Neurological: She is alert and oriented to person, place, and time.  Skin: Skin is warm and dry.   Filed Vitals:   02/18/15 0803  BP: 114/62  Pulse: 106  Temp: 98.3  F (36.8 C)  TempSrc: Oral  Resp: 12  Height: 5\' 3"  (1.6 m)  Weight: 183 lb 12.8 oz (83.371 kg)  SpO2: 99%      Assessment & Plan:  Visit time 25 minutes, greater than 50% of which was spent in coordination of care and counseling with the patient about her ADD and the risks of the stimulant medications she has been taking to treat it.

## 2015-02-23 ENCOUNTER — Other Ambulatory Visit: Payer: Self-pay | Admitting: Internal Medicine

## 2015-02-23 NOTE — Telephone Encounter (Signed)
Patient need a refill of her birth control medication [place].

## 2015-03-16 ENCOUNTER — Encounter: Payer: Self-pay | Admitting: Internal Medicine

## 2015-04-05 ENCOUNTER — Telehealth: Payer: Self-pay | Admitting: *Deleted

## 2015-04-05 ENCOUNTER — Other Ambulatory Visit: Payer: Self-pay

## 2015-04-05 MED ORDER — METHYLPHENIDATE HCL 10 MG PO TABS: 15.0000 mg | ORAL_TABLET | Freq: Every day | ORAL | Status: DC

## 2015-04-05 NOTE — Telephone Encounter (Signed)
Printed for MD to sign.

## 2015-04-05 NOTE — Telephone Encounter (Signed)
Left msg on triage requesting refills on both ADD med. Is this ok...Raechel Chute

## 2015-04-06 ENCOUNTER — Telehealth: Payer: Self-pay | Admitting: Internal Medicine

## 2015-04-06 NOTE — Telephone Encounter (Signed)
Pt picked Ritalin 10 mg tablet script but also stated she needs to also get the long acting RX that she takes in the a.m. - Metadate.  She is going out of town on Saturday and needs to get this ASAP.

## 2015-04-06 NOTE — Telephone Encounter (Signed)
Notified pt rx ready for pick-up.../lmb 

## 2015-04-07 MED ORDER — METHYLPHENIDATE HCL ER (CD) 20 MG PO CPCR: 60.0000 mg | ORAL_CAPSULE | ORAL | Status: DC

## 2015-04-07 NOTE — Telephone Encounter (Signed)
Printed for MD to sign.

## 2015-04-07 NOTE — Telephone Encounter (Signed)
LVM for pt to call back as soon as possible.   

## 2015-05-23 ENCOUNTER — Other Ambulatory Visit: Payer: Self-pay

## 2015-05-23 MED ORDER — LEVONORGESTREL-ETHINYL ESTRAD 0.1-20 MG-MCG PO TABS: 1.0000 | ORAL_TABLET | Freq: Every day | ORAL | Status: DC

## 2015-08-24 MED FILL — LEVONOR-ETH ESTRAD 0.1-0.02: 0.1-20 | 28 days supply | Qty: 28 | Fill #1

## 2015-08-24 MED FILL — VYVANSE 30 MG CAPSULE: 30 | 30 days supply | Qty: 30 | Fill #0

## 2015-08-26 ENCOUNTER — Other Ambulatory Visit: Payer: Self-pay | Admitting: Internal Medicine

## 2015-09-23 MED FILL — VYVANSE 30 MG CAPSULE: 30 | 30 days supply | Qty: 30 | Fill #0

## 2015-09-24 NOTE — Telephone Encounter (Signed)
Tried to call pt. Not able to lvm: "not accepting calls".   Denying rf req and faxing same to pharmacy.

## 2015-09-27 MED FILL — LEVONOR-ETH ESTRAD 0.1-0.02: 0.1-20 | 28 days supply | Qty: 28 | Fill #0

## 2015-09-27 NOTE — Telephone Encounter (Signed)
Patient has scheduled 3/7 appt with dr burns---routing to dr burns, are you ok with refilling until the appt---please advise, thanks

## 2015-09-27 NOTE — Telephone Encounter (Signed)
Pt was wondering if we can give her some of her med until she come to see Dr. Lawerance Bach on 10/25/15. Please help, [facility]

## 2015-09-27 NOTE — Telephone Encounter (Signed)
Ok to refill 

## 2015-10-25 ENCOUNTER — Encounter: Payer: Self-pay | Admitting: Internal Medicine

## 2015-10-25 ENCOUNTER — Ambulatory Visit (INDEPENDENT_AMBULATORY_CARE_PROVIDER_SITE_OTHER): Payer: 59 | Admitting: Internal Medicine

## 2015-10-25 VITALS — BP 112/72 | HR 79 | Temp 98.5°F | Resp 16 | Wt 180.0 lb

## 2015-10-25 DIAGNOSIS — F909 Attention-deficit hyperactivity disorder, unspecified type: Secondary | ICD-10-CM

## 2015-10-25 DIAGNOSIS — F988 Other specified behavioral and emotional disorders with onset usually occurring in childhood and adolescence: Secondary | ICD-10-CM

## 2015-10-25 MED ORDER — LEVONORGESTREL-ETHINYL ESTRAD 0.1-20 MG-MCG PO TABS: 1.0000 | ORAL_TABLET | Freq: Every day | ORAL | Status: DC

## 2015-10-25 MED FILL — LEVONOR-ETH ESTRAD 0.1-0.02: 0.1-20 | 84 days supply | Qty: 84 | Fill #0

## 2015-10-25 NOTE — Patient Instructions (Signed)
All other Health Maintenance issues reviewed.   All recommended immunizations and age-appropriate screenings are up-to-date.  No immunizations administered today.   Medications reviewed and updated.  No changes recommended at this time.  Your prescription(s) have been submitted to your pharmacy. Please take as directed and contact our office if you believe you are having problem(s) with the medication(s).   Please follow up as needed

## 2015-10-25 NOTE — Progress Notes (Signed)
Pre visit review using our clinic review tool, if applicable. No additional management support is needed unless otherwise documented below in the visit note. 

## 2015-10-25 NOTE — Progress Notes (Signed)
Subjective:    Patient ID: Sandra Dawson, female    DOB: July 07, 1996, 20 y.o.   MRN: [id number]  HPI She is here to establish with a new pcp.  She needs a refill of her birth control.   ADD:  She is following with the ADD clinic in [place] and gets her medication from them. She is happy with her current medication and denies any side effects. She feels the medication has been very helpful for her.    She will be establishing with a gyn in summer, but needs a refill of her birth control until then.  She started birth control in September 2014.  She denies headaches, painful periods, heavy periods.  She is not sexually active.    She has no other concerns.   Medications and allergies reviewed with patient and updated if appropriate.  Patient Active Problem List   Diagnosis Date Noted  . Obese   . ADD (attention deficit disorder)     Current Outpatient Prescriptions on File Prior to Visit  Medication Sig Dispense Refill  . fluocinonide cream (LIDEX) 0.05 % Apply 1 application topically 2 (two) times daily.    . methylphenidate (RITALIN) 10 MG tablet Take 1.5 tablets (15 mg total) by mouth daily. 45 tablet 0  . Multiple Vitamin (MULTIVITAMIN) tablet Take 1 tablet by mouth daily.     No current facility-administered medications on file prior to visit.    Past Medical History  Diagnosis Date  . ADHD (attention deficit hyperactivity disorder)   . History of chicken pox     Past Surgical History  Procedure Laterality Date  . No past surgeries      Social History   Social History  . Marital Status: Single    Spouse Name: N/A  . Number of Children: N/A  . Years of Education: N/A   Social History Main Topics  . Smoking status: Never Smoker   . Smokeless tobacco: None  . Alcohol Use: No  . Drug Use: No  . Sexual Activity: No   Other Topics Concern  . None   Social History Narrative   In college - Production designer, theatre/television/film regularly    Family  History  Problem Relation Age of Onset  . Cancer Other     family hx colon & lung cancer  . Diabetes Other   . Hyperlipidemia Other   . Heart attack Other   . Hyperlipidemia Father     Review of Systems  Constitutional: Negative for fever, chills, appetite change and unexpected weight change.  Respiratory: Negative for cough, shortness of breath and wheezing.   Cardiovascular: Negative for chest pain, palpitations and leg swelling.  Gastrointestinal: Negative for nausea and abdominal pain.  Neurological: Positive for light-headedness (if stands up quickly). Negative for dizziness and headaches.  Psychiatric/Behavioral: Negative for sleep disturbance and dysphoric mood. The patient is not nervous/anxious.        Objective:   Filed Vitals:   10/25/15 0916  BP: 112/72  Pulse: 79  Temp: 98.5 F (36.9 C)  Resp: 16   Filed Weights   10/25/15 0916  Weight: 180 lb (81.647 kg)   Body mass index is 31.89 kg/(m^2).   Physical Exam Constitutional: Appears well-developed and well-nourished. No distress.  Neck: Neck supple. No tracheal deviation present. No thyromegaly present.  No carotid bruit. No cervical adenopathy.   Cardiovascular: Normal rate, regular rhythm and normal heart sounds.   No murmur heard.  No edema Pulmonary/Chest: Effort normal and breath sounds normal. No respiratory distress. No wheezes.  Psych: normal mood and affect       Assessment & Plan:   Birth control refilled.  She will establish with a gyn this summer.  After that she will get her birth control from gyn.  See Problem List for Assessment and Plan of chronic medical problems.

## 2015-10-25 NOTE — Assessment & Plan Note (Signed)
Going to the ADD clinic in [place] and they are prescribing her medication No side effects and happy with her current dose

## 2015-10-26 MED FILL — VYVANSE 30 MG CAPSULE: 30 | 30 days supply | Qty: 30 | Fill #0

## 2015-11-24 MED FILL — VYVANSE 30 MG CAPSULE: 30 | 30 days supply | Qty: 30 | Fill #0

## 2015-12-05 MED FILL — KETOCONAZOLE 2% SHAMPOO: 2 | 10 days supply | Qty: 120 | Fill #0

## 2015-12-22 MED FILL — VYVANSE 30 MG CAPSULE: 30 | 30 days supply | Qty: 30 | Fill #0

## 2016-01-19 DIAGNOSIS — F902 Attention-deficit hyperactivity disorder, combined type: Secondary | ICD-10-CM | POA: Diagnosis not present

## 2016-01-19 DIAGNOSIS — F1920 Other psychoactive substance dependence, uncomplicated: Secondary | ICD-10-CM | POA: Diagnosis not present

## 2016-01-19 DIAGNOSIS — Z79899 Other long term (current) drug therapy: Secondary | ICD-10-CM | POA: Diagnosis not present

## 2016-01-19 DIAGNOSIS — F419 Anxiety disorder, unspecified: Secondary | ICD-10-CM | POA: Diagnosis not present

## 2016-01-19 MED FILL — VYVANSE 30 MG CAPSULE: 30 | 30 days supply | Qty: 30 | Fill #0

## 2016-01-25 MED FILL — LEVONOR-ETH ESTRAD 0.1-0.02: 0.1-20 | 84 days supply | Qty: 84 | Fill #1

## 2016-02-01 DIAGNOSIS — Z01419 Encounter for gynecological examination (general) (routine) without abnormal findings: Secondary | ICD-10-CM | POA: Diagnosis not present

## 2016-02-01 DIAGNOSIS — Z6831 Body mass index (BMI) 31.0-31.9, adult: Secondary | ICD-10-CM | POA: Diagnosis not present

## 2016-02-24 MED FILL — VYVANSE 30 MG CAPSULE: 30 | 30 days supply | Qty: 30 | Fill #0

## 2016-03-16 MED FILL — KETOCONAZOLE 2% SHAMPOO: 2 | 10 days supply | Qty: 120 | Fill #1

## 2016-03-29 MED FILL — VYVANSE 30 MG CAPSULE: 30 | 30 days supply | Qty: 30 | Fill #0

## 2016-04-11 MED FILL — LEVONOR-ETH ESTRAD 0.1-0.02: 0.1-20 | 84 days supply | Qty: 84 | Fill #0

## 2016-04-27 MED FILL — VYVANSE 30 MG CAPSULE: 30 | 30 days supply | Qty: 30 | Fill #0

## 2016-05-23 MED FILL — KETOCONAZOLE 2% SHAMPOO: 2 | 10 days supply | Qty: 120 | Fill #2

## 2016-05-25 MED FILL — VYVANSE 30 MG CAPSULE: 30 | 30 days supply | Qty: 30 | Fill #0

## 2016-06-22 MED FILL — VYVANSE 30 MG CAPSULE: 30 | 30 days supply | Qty: 30 | Fill #0

## 2016-07-06 ENCOUNTER — Encounter: Payer: Self-pay | Admitting: Nurse Practitioner

## 2016-07-06 ENCOUNTER — Ambulatory Visit (INDEPENDENT_AMBULATORY_CARE_PROVIDER_SITE_OTHER): Payer: 59 | Admitting: Nurse Practitioner

## 2016-07-06 VITALS — BP 122/88 | HR 86 | Temp 98.6°F | Wt 175.0 lb

## 2016-07-06 DIAGNOSIS — N644 Mastodynia: Secondary | ICD-10-CM

## 2016-07-06 DIAGNOSIS — F902 Attention-deficit hyperactivity disorder, combined type: Secondary | ICD-10-CM | POA: Diagnosis not present

## 2016-07-06 DIAGNOSIS — Z79899 Other long term (current) drug therapy: Secondary | ICD-10-CM | POA: Diagnosis not present

## 2016-07-06 DIAGNOSIS — F419 Anxiety disorder, unspecified: Secondary | ICD-10-CM | POA: Diagnosis not present

## 2016-07-06 NOTE — Progress Notes (Signed)
Pre visit review using our clinic review tool, if applicable. No additional management support is needed unless otherwise documented below in the visit note. 

## 2016-07-06 NOTE — Progress Notes (Signed)
Subjective:  Patient ID: Sandra Dawson, female    DOB: [birth year]  Age: 20 y.o. MRN: [id number]  CC: Breast Problem (Pt stated lt side of the breast have bump and little sore)   HPI Sandra Dawson presents today with left breast tenderness 2 days, denies any nipple discharge or mass or chest wall injury. No fever, no chest pain, no cough, no shortness of breath. She is expecting next menstrual cycle next week. Current use of oral birth control.  Denies being sexually active.  Outpatient Medications Prior to Visit  Medication Sig Dispense Refill  . fluocinonide cream (LIDEX) 0.05 % Apply 1 application topically 2 (two) times daily.    Marland Kitchen. levonorgestrel-ethinyl estradiol (AVIANE,ALESSE,LESSINA) 0.1-20 MG-MCG tablet Take 1 tablet by mouth daily. 90 tablet 1  . lisdexamfetamine (VYVANSE) 30 MG capsule Take 30 mg by mouth daily.    . methylphenidate (RITALIN) 10 MG tablet Take 1.5 tablets (15 mg total) by mouth daily. 45 tablet 0  . Multiple Vitamin (MULTIVITAMIN) tablet Take 1 tablet by mouth daily.     No facility-administered medications prior to visit.     ROS See HPI  Objective:  BP 122/88 (BP Location: Left Arm, Patient Position: Sitting, Cuff Size: Normal)   Pulse 86   Temp 98.6 F (37 C)   Wt 175 lb (79.4 kg)   LMP 06/08/2016 (Approximate) Comment: current use of OCP  SpO2 97%   BMI 31.00 kg/m   BP Readings from Last 3 Encounters:  07/06/16 122/88  10/25/15 112/72  02/18/15 114/62    Wt Readings from Last 3 Encounters:  07/06/16 175 lb (79.4 kg)  10/25/15 180 lb (81.6 kg)  02/18/15 183 lb 12.8 oz (83.4 kg)    Physical Exam  Constitutional: She is oriented to person, place, and time. No distress.  Neck: Normal range of motion. Neck supple.  Cardiovascular: Normal rate and normal heart sounds.   Pulmonary/Chest: Effort normal and breath sounds normal. No respiratory distress. She exhibits no tenderness and no bony tenderness. Right breast exhibits no inverted  nipple, no mass, no nipple discharge, no skin change and no tenderness. Left breast exhibits no inverted nipple, no mass, no nipple discharge, no skin change and no tenderness. Breasts are symmetrical.  Musculoskeletal: She exhibits no edema or tenderness.  Lymphadenopathy:    She has no cervical adenopathy.  Neurological: She is alert and oriented to person, place, and time.  Skin: Skin is warm and dry. No rash noted. No erythema.  Vitals reviewed.   Lab Results  Component Value Date   WBC 8.1 01/26/2014   HGB 14.2 01/26/2014   HCT 42.0 01/26/2014   PLT 309.0 01/26/2014   GLUCOSE 94 01/26/2014   CHOL 169 01/26/2014   TRIG 73.0 01/26/2014   HDL 62.00 01/26/2014   LDLCALC 92 01/26/2014   ALT 15 01/26/2014   AST 21 01/26/2014   NA 137 01/26/2014   K 4.4 01/26/2014   CL 103 01/26/2014   CREATININE 0.7 01/26/2014   BUN 15 01/26/2014   CO2 26 01/26/2014   TSH 2.02 01/26/2014    Dg Chest 2 View  Result Date: 05/19/2012 *RADIOLOGY REPORT* Clinical Data: Fever, cough, wheezing. CHEST - 2 VIEW Comparison: None. Findings: There is a consolidative pneumonia in the posterior aspect of the right lower lobe.  Left lung is clear.  Heart size and vascularity are normal.  No effusions.  No osseous abnormality. IMPRESSION: Right lower lobe pneumonia. Original Report Authenticated By: Gwynn BurlyJAMES H. MAXWELL, M.D.  Assessment & Plan:   Sandra Dawson was seen today for breast problem.  Diagnoses and all orders for this visit:  Breast tenderness in female   I am having Sandra Dawson maintain her multivitamin, fluocinonide cream, methylphenidate, lisdexamfetamine, and levonorgestrel-ethinyl estradiol.  No orders of the defined types were placed in this encounter.   Follow-up: Return if symptoms worsen or fail to improve.  Alysia Pennaharlotte Nche, NP

## 2016-07-06 NOTE — Patient Instructions (Addendum)
Since menstruation is about to start next week, re check breast 1week after completion of cycle. Return to office if breast tenderness persist after completion of menstrual cycle.  Breast Tenderness Breast tenderness is a common problem for women of all ages. Breast tenderness may cause mild discomfort to severe pain. The pain usually comes and goes in association with your menstrual cycle, but it can be constant. Breast tenderness has many possible causes, including hormone changes and some medicines. Your health care provider may order tests, such as a mammogram or an ultrasound, to check for any unusual findings. Having breast tenderness usually does not mean that you have breast cancer. Follow these instructions at home: Sometimes, reassurance that you do not have breast cancer is all that is needed. In general, follow these home care instructions: Managing pain and discomfort  If directed, apply ice to the area:  Put ice in a plastic bag.  Place a towel between your skin and the bag.  Leave the ice on for 20 minutes, 2-3 times a day.  Make sure you are wearing a supportive bra, especially during exercise. You may also want to wear a supportive bra while sleeping if your breasts are very tender. Medicines  Take over-the-counter and prescription medicines only as told by your health care provider. If the cause of your pain is infection, you may be prescribed an antibiotic medicine.  If you were prescribed an antibiotic, take it as told by your health care provider. Do not stop taking the antibiotic even if you start to feel better. General instructions  Your health care provider may recommend that you reduce the amount of fat in your diet. You can do this by:  Limiting fried foods.  Cooking foods using methods, such as baking, boiling, grilling, and broiling.  Decrease the amount of caffeine in your diet. You can do this by drinking more water and choosing caffeine-free  options.  Keep a log of the days and times when your breasts are most tender.  Ask your health care provider how to do breast exams at home. This will help you notice if you have an unusual growth or lump. Contact a health care provider if:  Any part of your breast is hard, red, and hot to the touch. This may be a sign of infection.  You are not breastfeeding and you have fluid, especially blood or pus, coming out of your nipples.  You have a fever.  You have a new or painful lump in your breast that remains after your menstrual period ends.  Your pain does not improve or it gets worse.  Your pain is interfering with your daily activities. This information is not intended to replace advice given to you by your health care provider. Make sure you discuss any questions you have with your health care provider. Document Released: 07/19/2008 Document Revised: 05/04/2016 Document Reviewed: 05/04/2016 Elsevier Interactive Patient Education  2017 [employer]

## 2016-07-10 MED FILL — LEVONOR-ETH ESTRAD 0.1-0.02: 0.1-20 | 84 days supply | Qty: 84 | Fill #1

## 2016-07-16 MED FILL — VYVANSE 30 MG CAPSULE: 30 | 30 days supply | Qty: 30 | Fill #0

## 2016-08-06 DIAGNOSIS — H5213 Myopia, bilateral: Secondary | ICD-10-CM | POA: Diagnosis not present

## 2016-08-09 ENCOUNTER — Encounter: Payer: Self-pay | Admitting: Nurse Practitioner

## 2016-08-09 ENCOUNTER — Ambulatory Visit (INDEPENDENT_AMBULATORY_CARE_PROVIDER_SITE_OTHER): Payer: 59 | Admitting: Nurse Practitioner

## 2016-08-09 VITALS — BP 102/78 | HR 86 | Temp 98.3°F | Ht 63.0 in | Wt 175.0 lb

## 2016-08-09 DIAGNOSIS — N644 Mastodynia: Secondary | ICD-10-CM | POA: Diagnosis not present

## 2016-08-09 NOTE — Patient Instructions (Addendum)
You will be called to schedule left breast mamm[facility] [phone]   Breast Tenderness Breast tenderness is a common problem for women of all ages. Breast tenderness may cause mild discomfort to severe pain. The pain usually comes and goes in association with your menstrual cycle, but it can be constant. Breast tenderness has many possible causes, including hormone changes and some medicines. Your health care provider may order tests, such as a mammogram or an ultrasound, to check for any unusual findings. Having breast tenderness usually does not mean that you have breast cancer. Follow these instructions at home: Sometimes, reassurance that you do not have breast cancer is all that is needed. In general, follow these home care instructions: Managing pain and discomfort  If directed, apply ice to the area:  Put ice in a plastic bag.  Place a towel between your skin and the bag.  Leave the ice on for 20 minutes, 2-3 times a day.  Make sure you are wearing a supportive bra, especially during exercise. You may also want to wear a supportive bra while sleeping if your breasts are very tender. Medicines  Take over-the-counter and prescription medicines only as told by your health care provider. If the cause of your pain is infection, you may be prescribed an antibiotic medicine.  If you were prescribed an antibiotic, take it as told by your health care provider. Do not stop taking the antibiotic even if you start to feel better. General instructions  Your health care provider may recommend that you reduce the amount of fat in your diet. You can do this by:  Limiting fried foods.  Cooking foods using methods, such as baking, boiling, grilling, and broiling.  Decrease the amount of caffeine in your diet. You can do this by drinking more water and choosing caffeine-free options.  Keep a log of the days and times when your breasts are most tender.  Ask  your health care provider how to do breast exams at home. This will help you notice if you have an unusual growth or lump. Contact a health care provider if:  Any part of your breast is hard, red, and hot to the touch. This may be a sign of infection.  You are not breastfeeding and you have fluid, especially blood or pus, coming out of your nipples.  You have a fever.  You have a new or painful lump in your breast that remains after your menstrual period ends.  Your pain does not improve or it gets worse.  Your pain is interfering with your daily activities. This information is not intended to replace advice given to you by your health care provider. Make sure you discuss any questions you have with your health care provider. Document Released: 07/19/2008 Document Revised: 05/04/2016 Document Reviewed: 05/04/2016 Elsevier Interactive Patient Education  2017 [employer]

## 2016-08-09 NOTE — Progress Notes (Signed)
Subjective:  Patient ID: Sandra Dawson, female    DOB: February 06, 1996  Age: 20 y.o. MRN: [id number]  CC: Breast Mass (spot on left breast,feel like like lump, come and goes when she has her period. )  HPI Sandra Dawson presents with persistent left breast pain x 4weeks. Pain did not improve after completion of menstrual cycle. She has eliminated caffeine use, but no improvement. No FH of breast cancer.   Outpatient Medications Prior to Visit  Medication Sig Dispense Refill  . fluocinonide cream (LIDEX) 0.05 % Apply 1 application topically 2 (two) times daily.    Marland Kitchen. levonorgestrel-ethinyl estradiol (AVIANE,ALESSE,LESSINA) 0.1-20 MG-MCG tablet Take 1 tablet by mouth daily. 90 tablet 1  . lisdexamfetamine (VYVANSE) 30 MG capsule Take 30 mg by mouth daily.    . methylphenidate (RITALIN) 10 MG tablet Take 1.5 tablets (15 mg total) by mouth daily. 45 tablet 0  . Multiple Vitamin (MULTIVITAMIN) tablet Take 1 tablet by mouth daily.     No facility-administered medications prior to visit.     ROS See HPI  Objective:  BP 102/78   Pulse 86   Temp 98.3 F (36.8 C)   Ht 5\' 3"  (1.6 m)   Wt 175 lb (79.4 kg)   SpO2 98%   BMI 31.00 kg/m   BP Readings from Last 3 Encounters:  08/09/16 102/78  07/06/16 122/88  10/25/15 112/72    Wt Readings from Last 3 Encounters:  08/09/16 175 lb (79.4 kg)  07/06/16 175 lb (79.4 kg)  10/25/15 180 lb (81.6 kg)    Physical Exam  Constitutional: No distress.  Neck: Normal range of motion. Neck supple.  Cardiovascular: Normal rate.   Pulmonary/Chest: Effort normal. She exhibits no mass and no tenderness. Right breast exhibits no inverted nipple, no mass, no nipple discharge, no skin change and no tenderness. Left breast exhibits tenderness. Left breast exhibits no inverted nipple, no mass, no nipple discharge and no skin change. Breasts are symmetrical.  Tenderness on lateral aspect of left breast.  Lymphadenopathy:    She has no cervical adenopathy.    Skin: Skin is warm and dry. No rash noted. No erythema.  Vitals reviewed.   Lab Results  Component Value Date   WBC 8.1 01/26/2014   HGB 14.2 01/26/2014   HCT 42.0 01/26/2014   PLT 309.0 01/26/2014   GLUCOSE 94 01/26/2014   CHOL 169 01/26/2014   TRIG 73.0 01/26/2014   HDL 62.00 01/26/2014   LDLCALC 92 01/26/2014   ALT 15 01/26/2014   AST 21 01/26/2014   NA 137 01/26/2014   K 4.4 01/26/2014   CL 103 01/26/2014   CREATININE 0.7 01/26/2014   BUN 15 01/26/2014   CO2 26 01/26/2014   TSH 2.02 01/26/2014    Dg Chest 2 View  Result Date: 05/19/2012 *RADIOLOGY REPORT* Clinical Data: Fever, cough, wheezing. CHEST - 2 VIEW Comparison: None. Findings: There is a consolidative pneumonia in the posterior aspect of the right lower lobe.  Left lung is clear.  Heart size and vascularity are normal.  No effusions.  No osseous abnormality. IMPRESSION: Right lower lobe pneumonia. Original Report Authenticated By: Gwynn BurlyJAMES H. MAXWELL, M.D.    Assessment & Plan:   Sandra Dawson was seen today for breast mass.  Diagnoses and all orders for this visit:  Breast pain, left -     MM DIAG BREAST TOMO UNI LEFT; Future -     US BREAST LTD UNI LEFT INC AXILLA; Future -     MM  DIAG BREAST TOMO BILATERAL; Future   I am having Sandra Dawson maintain her multivitamin, fluocinonide cream, methylphenidate, lisdexamfetamine, and levonorgestrel-ethinyl estradiol.  No orders of the defined types were placed in this encounter.   Follow-up: Return if symptoms worsen or fail to improve.  Sandra Pennaharlotte Synai Prettyman, NP

## 2016-08-09 NOTE — Progress Notes (Signed)
Pre visit review using our clinic review tool, if applicable. No additional management support is needed unless otherwise documented below in the visit note. 

## 2016-08-14 MED FILL — VYVANSE 30 MG CAPSULE: 30 | 30 days supply | Qty: 30 | Fill #0

## 2016-08-17 ENCOUNTER — Ambulatory Visit
Admission: RE | Admit: 2016-08-17 | Discharge: 2016-08-17 | Disposition: A | Discharge status: Home / Self Care | Payer: 59 | Source: Ambulatory Visit | Attending: Nurse Practitioner | Admitting: Nurse Practitioner

## 2016-08-17 DIAGNOSIS — N644 Mastodynia: Secondary | ICD-10-CM

## 2016-08-17 NOTE — Progress Notes (Signed)
Normal results, see office note

## 2016-09-24 MED FILL — VYVANSE 30 MG CAPSULE: 30 | 30 days supply | Qty: 30 | Fill #0

## 2016-09-27 MED FILL — LEVONOR-ETH ESTRAD 0.1-0.02: 0.1-20 | 84 days supply | Qty: 84 | Fill #2

## 2016-09-27 MED FILL — KETOCONAZOLE 2% SHAMPOO: 2 | 10 days supply | Qty: 120 | Fill #3

## 2016-10-23 MED FILL — VYVANSE 30 MG CAPSULE: 30 | 30 days supply | Qty: 30 | Fill #0

## 2016-10-24 DIAGNOSIS — F902 Attention-deficit hyperactivity disorder, combined type: Secondary | ICD-10-CM | POA: Diagnosis not present

## 2016-10-24 DIAGNOSIS — Z79899 Other long term (current) drug therapy: Secondary | ICD-10-CM | POA: Diagnosis not present

## 2016-10-24 DIAGNOSIS — F419 Anxiety disorder, unspecified: Secondary | ICD-10-CM | POA: Diagnosis not present

## 2016-10-26 DIAGNOSIS — H60392 Other infective otitis externa, left ear: Secondary | ICD-10-CM | POA: Diagnosis not present

## 2016-11-16 ENCOUNTER — Telehealth: Payer: 59 | Admitting: Nurse Practitioner

## 2016-11-16 DIAGNOSIS — B372 Candidiasis of skin and nail: Secondary | ICD-10-CM | POA: Diagnosis not present

## 2016-11-16 MED ORDER — NYSTATIN 100000 UNIT/GM EX CREA: 1.0000 "application " | TOPICAL_CREAM | Freq: Two times a day (BID) | CUTANEOUS | 0 refills | Status: DC

## 2016-11-16 MED FILL — NYSTATIN 100,000 UNIT/GM CR: 100000 | 10 days supply | Qty: 30 | Fill #0

## 2016-11-16 NOTE — Addendum Note (Signed)
Addended by: Bennie Pierini on: 11/16/2016 11:22 AM   Modules accepted: Orders

## 2016-11-16 NOTE — Progress Notes (Signed)
E Visit for Rash  We are sorry that you are not feeling well. Here is how we plan to help!    Based upon your presentation it appears you have a fungal infection.  I have prescribed: and Nystatin cream apply to the affected area twice daily   HOME CARE:   Take cool showers and avoid direct sunlight.  Apply cool compress or wet dressings.  Take a bath in an oatmeal bath.  Sprinkle content of one Aveeno packet under running faucet with comfortably warm water.  Bathe for 15-20 minutes, 1-2 times daily.  Pat dry with a towel. Do not rub the rash.  Use hydrocortisone cream.  Take an antihistamine like Benadryl for widespread rashes that itch.  The adult dose of Benadryl is 25-50 mg by mouth 4 times daily.  Caution:  This type of medication may cause sleepiness.  Do not drink alcohol, drive, or operate dangerous machinery while taking antihistamines.  Do not take these medications if you have prostate enlargement.  Read package instructions thoroughly on all medications that you take.  GET HELP RIGHT AWAY IF:   Symptoms don't go away after treatment.  Severe itching that persists.  If you rash spreads or swells.  If you rash begins to smell.  If it blisters and opens or develops a yellow-brown crust.  You develop a fever.  You have a sore throat.  You become short of breath.  MAKE SURE YOU:  Understand these instructions. Will watch your condition. Will get help right away if you are not doing well or get worse.  Thank you for choosing an e-visit. Your e-visit answers were reviewed by a board certified advanced clinical practitioner to complete your personal care plan. Depending upon the condition, your plan could have included both over the counter or prescription medications. Please review your pharmacy choice. Be sure that the pharmacy you have chosen is open so that you can pick up your prescription now.  If there is a problem you may message your provider in MyChart  to have the prescription routed to another pharmacy. Your safety is important to us. If you have drug allergies check your prescription carefully.  For the next 24 hours, you can use MyChart to ask questions about today's visit, request a non-urgent call back, or ask for a work or school excuse from your e-visit provider. You will get an email in the next two days asking about your experience. I hope that your e-visit has been valuable and will speed your recovery.      

## 2016-11-19 MED FILL — VYVANSE 30 MG CAPSULE: 30 | 30 days supply | Qty: 30 | Fill #0

## 2016-12-20 MED FILL — VYVANSE 30 MG CAPSULE: 30 | 30 days supply | Qty: 30 | Fill #0

## 2016-12-20 MED FILL — LEVONOR-ETH ESTRAD 0.1-0.02: 0.1-20 | 84 days supply | Qty: 84 | Fill #3

## 2017-01-23 MED FILL — VYVANSE 30 MG CAPSULE: 30 | 30 days supply | Qty: 30 | Fill #0

## 2017-01-28 DIAGNOSIS — Z79899 Other long term (current) drug therapy: Secondary | ICD-10-CM | POA: Diagnosis not present

## 2017-01-28 DIAGNOSIS — F1920 Other psychoactive substance dependence, uncomplicated: Secondary | ICD-10-CM | POA: Diagnosis not present

## 2017-01-28 DIAGNOSIS — F902 Attention-deficit hyperactivity disorder, combined type: Secondary | ICD-10-CM | POA: Diagnosis not present

## 2017-01-28 DIAGNOSIS — F419 Anxiety disorder, unspecified: Secondary | ICD-10-CM | POA: Diagnosis not present

## 2017-02-06 DIAGNOSIS — Z01419 Encounter for gynecological examination (general) (routine) without abnormal findings: Secondary | ICD-10-CM | POA: Diagnosis not present

## 2017-02-06 DIAGNOSIS — Z6829 Body mass index (BMI) 29.0-29.9, adult: Secondary | ICD-10-CM | POA: Diagnosis not present

## 2017-02-13 ENCOUNTER — Encounter: Payer: Self-pay | Admitting: Family Medicine

## 2017-02-13 ENCOUNTER — Ambulatory Visit (INDEPENDENT_AMBULATORY_CARE_PROVIDER_SITE_OTHER): Payer: 59 | Admitting: Family Medicine

## 2017-02-13 VITALS — BP 120/60 | HR 99 | Temp 97.8°F | Resp 12 | Ht 63.0 in | Wt 166.0 lb

## 2017-02-13 DIAGNOSIS — J029 Acute pharyngitis, unspecified: Secondary | ICD-10-CM

## 2017-02-13 LAB — POCT RAPID STREP A (OFFICE): RAPID STREP A SCREEN: NEGATIVE

## 2017-02-13 NOTE — Patient Instructions (Signed)
Your strep test is negative  You likely have a virus or post nasal drainage from allergies  You can take Tylenol or Advil as directed on the package and do warm, salt water gargles

## 2017-02-13 NOTE — Progress Notes (Signed)
   Subjective:    Patient ID: Sandra ColeMadison N Dawson, female    DOB: Apr 08, 1996, 21 y.o.   MRN: [id number]  HPI This is a 21 yo female who presents today with sore throat that started today. Some post nasal drainage. Temp 99.3. No ear pain, no cough.  She is working [employer] for [employer] and is concerned about being contagious.   Past Medical History:  Diagnosis Date  . ADHD (attention deficit hyperactivity disorder)   . History of chicken pox    Past Surgical History:  Procedure Laterality Date  . NO PAST SURGERIES     Family History  Problem Relation Age of Onset  . Cancer Other        family hx colon & lung cancer  . Diabetes Other   . Hyperlipidemia Other   . Heart attack Other   . Hyperlipidemia Father    Social History  Substance Use Topics  . Smoking status: Never Smoker  . Smokeless tobacco: Never Used  . Alcohol use No      Review of Systems Per HPI    Objective:   Physical Exam  Constitutional: She is oriented to person, place, and time. She appears well-developed and well-nourished. No distress.  HENT:  Head: Normocephalic and atraumatic.  Right Ear: Tympanic membrane, external ear and ear canal normal.  Left Ear: Tympanic membrane, external ear and ear canal normal.  Nose: No mucosal edema or rhinorrhea.  Mouth/Throat: Uvula is midline. Posterior oropharyngeal erythema present. No oropharyngeal exudate or posterior oropharyngeal edema.  Eyes: Conjunctivae are normal.  Neck: Normal range of motion. Neck supple.  Cardiovascular: Normal rate, regular rhythm and normal heart sounds.   Pulmonary/Chest: Effort normal and breath sounds normal.  Lymphadenopathy:    She has no cervical adenopathy.  Neurological: She is alert and oriented to person, place, and time.  Skin: Skin is warm and dry. She is not diaphoretic.  Psychiatric: She has a normal mood and affect. Her behavior is normal. Judgment and thought content normal.  Vitals reviewed.     BP  120/60 (BP Location: Left Arm, Patient Position: Sitting, Cuff Size: Normal)   Pulse 99   Temp 97.8 F (36.6 C) (Oral)   Resp 12   Ht 5\' 3"  (1.6 m)   Wt 166 lb (75.3 kg)   LMP 01/22/2017 (Approximate)   SpO2 99%   BMI 29.41 kg/m  Wt Readings from Last 3 Encounters:  02/13/17 166 lb (75.3 kg)  08/09/16 175 lb (79.4 kg)  07/06/16 175 lb (79.4 kg)   Results for orders placed or performed in visit on 02/13/17  POCT rapid strep A  Result Value Ref Range   Rapid Strep A Screen Negative Negative       Assessment & Plan:  1. Pharyngitis, unspecified etiology - Provided written and verbal information regarding diagnosis and treatment. - likely viral or post nasal drainage - POCT rapid strep A- negative  Olean Reeeborah Gessner, FNP-BC  [facility] [place], [facility]  02/13/2017 2:30 PM

## 2017-02-18 ENCOUNTER — Encounter: Payer: Self-pay | Admitting: Internal Medicine

## 2017-02-18 ENCOUNTER — Ambulatory Visit (INDEPENDENT_AMBULATORY_CARE_PROVIDER_SITE_OTHER): Payer: 59 | Admitting: Internal Medicine

## 2017-02-18 VITALS — BP 120/80 | HR 77 | Temp 98.5°F | Resp 16 | Wt 166.0 lb

## 2017-02-18 DIAGNOSIS — M542 Cervicalgia: Secondary | ICD-10-CM

## 2017-02-18 NOTE — Assessment & Plan Note (Signed)
intermittent and mild in nature - likely muscular, not arthritic in nature Likely related to poor ergonomics with computer use and watching TV Aleve, massage, heat prn Will refer to sports medicine for further evaluation
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
## 2017-02-18 NOTE — Patient Instructions (Addendum)
Make an appointment with Dr Katrinka BlazingSmith for neck pain.

## 2017-02-21 MED FILL — VYVANSE 30 MG CAPSULE: 30 | 30 days supply | Qty: 30 | Fill #0

## 2017-03-07 ENCOUNTER — Ambulatory Visit (INDEPENDENT_AMBULATORY_CARE_PROVIDER_SITE_OTHER): Payer: 59 | Admitting: Sports Medicine

## 2017-03-07 VITALS — BP 118/78 | Ht 63.0 in | Wt 166.0 lb

## 2017-03-07 DIAGNOSIS — M542 Cervicalgia: Secondary | ICD-10-CM | POA: Diagnosis not present
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
## 2017-03-07 NOTE — Patient Instructions (Signed)
Office Place Ergonomics  Your Chair  Push your hips as far back as they can go in the chair.  Adjust the seat height so your feet are flat on the floor and your knees equal to, or slightly lower than, your hips.  Adjust the back of the chair to a 100-110 reclined angle. Make sure your upper and lower back are supported. Use inflatable cushions or small pillows if necessary. If you have an active back mechanism on your chair, use it to make frequent position changes.  Adjust the armrests (if fitted) so that your shoulders are relaxed. If your armrests are in the way, remove them. STEP 2: Your Keyboard An articulating keyboard tray can provide optimal positioning of input devices. However, it should accommodate the mouse, enable leg clearance, and have an adjustable height and tilt mechanism. The tray should not push you too far away from other work materials, such as your telephone.  Pull up close to your keyboard.  Position the keyboard directly in front of your body.  Determine what section of the keyboard you use most frequently, and readjust the keyboard so that section is centred with your body.  Adjust the keyboard height so that your shoulders are relaxed, your elbows are in a slightly open position (100 to 110), and your wrists and hands are straight.  The tilt of your keyboard is dependent upon your sitting position. Use the keyboard tray mechanism, or keyboard feet, to adjust the tilt. If you sit in a forward or upright position, try tilting your keyboard away from you at a negative angle. If you are reclined, a slight positive tilt will help maintain a straight wrist position.  Wristrests can help to maintain neutral postures and pad hard surfaces. However, the wristrest should only be used to rest the palms of the hands between keystrokes. Resting on the wristrest while typing is not recommended. Avoid using excessively wide wristrests, or wristrests that are higher than the space bar  of your keyboard.  Place the pointer as close as possible to the keyboard. Placing it on a slightly inclined surface, or using it on a mousebridge placed over the 10-keypad, can help to bring it closer. If you do not have a fully adjustable keyboard tray, you may need to adjust your workstation height, the height of your chair, or use a seat cushion to get into a comfortable position. Remember to use a footrest if your feet dangle. STEP 3: Screen, Document, and Telephone Incorrect positioning of the screen and source documents can result in awkward postures. Adjust the screen and source documents so that your neck is in a neutral, relaxed position.  Centre the screen directly in front of you, above your keyboard.  Position the top of the screen approximately 2-3" above seated eye level. (If you wear bifocals, lower the screen to a comfortable reading level.)  Sit at least an arm's length away from the screenand then adjust the distance for your vision.  Reduce glare by careful positioning of the screen.Position source documents directly in front of you, between the screen and the keyboard, using an in-line copy stand. If there is insufficient space, place source documents on a document holder positioned adjacent to the screen.  Place screen at right angles to windows Adjust curtains or blinds as needed  Adjust the vertical screen angle and screen controls to minimize glare from overhead lights  Other techniques to reduce glare include use of optical glass glare filters, light filters, or secondary  task lights Place your telephone within easy reach. Telephone stands or arms can help.  Use headsets and speaker phone to eliminate cradling the handset. STEP 4: Pauses and Breaks Once you have correctly set up your computer workstation use good work habits. No matter how perfect the environment, prolonged, static postures will inhibit blood circulation and take a toll on your body.  Take short 1-2 minute  stretch breaks every 20-30 minutes. After each hour of work, take a break or change tasks for at least 5-10 minutes. Always try to get away from your computer during lunch breaks.  Avoid eye fatigue by resting and refocusing your eyes periodically. Look away from the monitor and focus on something in the distance.  Rest your eyes by covering them with your palms for 10-15 seconds.

## 2017-03-20 MED FILL — LEVONOR-ETH ESTRAD 0.1-0.02: 0.1-20 | 84 days supply | Qty: 84 | Fill #0 | Status: TO

## 2017-03-25 MED FILL — VYVANSE 30 MG CAPSULE: 30 | 30 days supply | Qty: 30 | Fill #0

## 2017-04-02 MED FILL — KETOCONAZOLE 2% SHAMPOO: 2 | 30 days supply | Qty: 120 | Fill #0

## 2017-04-09 DIAGNOSIS — H5213 Myopia, bilateral: Secondary | ICD-10-CM | POA: Diagnosis not present

## 2017-04-23 MED FILL — VYVANSE 30 MG CAPSULE: 30 | 30 days supply | Qty: 30 | Fill #0

## 2017-04-26 DIAGNOSIS — Z79899 Other long term (current) drug therapy: Secondary | ICD-10-CM | POA: Diagnosis not present

## 2017-04-26 DIAGNOSIS — F419 Anxiety disorder, unspecified: Secondary | ICD-10-CM | POA: Diagnosis not present

## 2017-04-26 DIAGNOSIS — F902 Attention-deficit hyperactivity disorder, combined type: Secondary | ICD-10-CM | POA: Diagnosis not present

## 2017-05-21 MED FILL — VYVANSE 30 MG CAPSULE: 30 | 30 days supply | Qty: 30 | Fill #0

## 2017-06-12 MED FILL — LEVONOR-ETH ESTRAD 0.1-0.02: 0.1-20 | 84 days supply | Qty: 84 | Fill #0

## 2017-06-13 ENCOUNTER — Ambulatory Visit (INDEPENDENT_AMBULATORY_CARE_PROVIDER_SITE_OTHER): Payer: 59

## 2017-06-13 DIAGNOSIS — Z23 Encounter for immunization: Secondary | ICD-10-CM | POA: Diagnosis not present

## 2017-06-24 MED FILL — VYVANSE 30 MG CAPSULE: 30 | 30 days supply | Qty: 30 | Fill #0

## 2017-06-28 MED FILL — KETOCONAZOLE 2% SHAMPOO: 2 | 30 days supply | Qty: 120 | Fill #1

## 2017-06-29 ENCOUNTER — Encounter: Payer: Self-pay | Admitting: Family Medicine

## 2017-06-29 ENCOUNTER — Ambulatory Visit (INDEPENDENT_AMBULATORY_CARE_PROVIDER_SITE_OTHER): Payer: 59 | Admitting: Family Medicine

## 2017-06-29 VITALS — BP 110/74 | HR 88 | Temp 98.8°F | Wt 155.0 lb

## 2017-06-29 DIAGNOSIS — R053 Chronic cough: Secondary | ICD-10-CM

## 2017-06-29 DIAGNOSIS — R05 Cough: Secondary | ICD-10-CM

## 2017-06-29 NOTE — Progress Notes (Signed)
Subjective:     Patient ID: Sandra Dawson, female   DOB: [birth year], 21 y.o.   MRN: [id number]  HPI  Patient seen Saturday morning clinic with she states two-year history of cough. She is nonsmoker and has no history of asthma. She has somewhat of a mild dry intermittent cough. She denies any wheezing. No shortness of breath. No hemoptysis. No appetite or weight changes. No obvious reflux symptoms. No postnasal drip symptoms.  No clear exacerbating factors. No travels.    No pleuritic pain. No dyspnea on exertion. Does not use any mentholated products regularly.  Past Medical History:  Diagnosis Date  . ADHD (attention deficit hyperactivity disorder)   . History of chicken pox    Past Surgical History:  Procedure Laterality Date  . NO PAST SURGERIES      reports that  has never smoked. she has never used smokeless tobacco. She reports that she does not drink alcohol or use drugs. family history includes Cancer in her other; Diabetes in her other; Heart attack in her other; Hyperlipidemia in her father and other. No Known Allergies  Review of Systems  Constitutional: Negative for appetite change, chills, fever and unexpected weight change.  HENT: Negative for congestion, postnasal drip and sinus pressure.   Respiratory: Positive for cough. Negative for shortness of breath and wheezing.   Cardiovascular: Negative for chest pain.  Hematological: Negative for adenopathy.       Objective:   Physical Exam  Constitutional: She appears well-developed and well-nourished.  HENT:  Right Ear: External ear normal.  Left Ear: External ear normal.  Mouth/Throat: Oropharynx is clear and moist.  Neck: Neck supple.  Cardiovascular: Normal rate and regular rhythm.  Pulmonary/Chest: Effort normal and breath sounds normal. No respiratory distress. She has no wheezes.  Musculoskeletal: She exhibits no edema.  Lymphadenopathy:    She has no cervical adenopathy.       Assessment:      Chronic cough reportedly over 2 years duration. She denies any red flags such as appetite or weight change, hemoptysis, dyspnea, etc. Nonfocal exam. Differential should include GERD with silent symptoms versus postnasal drip    Plan:     -Consider elevate head of bed 4-6 inches and avoid eating within 2 hours at bedtime -Consider trial of over-the-counter PPI with Nexium or Prilosec 1 daily -Avoid mentholated products -Recommend follow-up with primary if cough not improving with PPI over the next 3-4 weeks  Kristian CoveyBruce W Chanteria Haggard MD [facility]

## 2017-06-29 NOTE — Patient Instructions (Signed)
Food Choices for Gastroesophageal Reflux Disease, Adult When you have gastroesophageal reflux disease (GERD), the foods you eat and your eating habits are very important. Choosing the right foods can help ease the discomfort of GERD. Consider working with a diet and nutrition specialist (dietitian) to help you make healthy food choices. What general guidelines should I follow? Eating plan  Choose healthy foods low in fat, such as fruits, vegetables, whole grains, low-fat dairy products, and lean meat, fish, and poultry.  Eat frequent, small meals instead of three large meals each day. Eat your meals slowly, in a relaxed setting. Avoid bending over or lying down until 2-3 hours after eating.  Limit high-fat foods such as fatty meats or fried foods.  Limit your intake of oils, butter, and shortening to less than 8 teaspoons each day.  Avoid the following: ? Foods that cause symptoms. These may be different for different people. Keep a food diary to keep track of foods that cause symptoms. ? Alcohol. ? Drinking large amounts of liquid with meals. ? Eating meals during the 2-3 hours before bed.  Cook foods using methods other than frying. This may include baking, grilling, or broiling. Lifestyle   Maintain a healthy weight. Ask your health care provider what weight is healthy for you. If you need to lose weight, work with your health care provider to do so safely.  Exercise for at least 30 minutes on 5 or more days each week, or as told by your health care provider.  Avoid wearing clothes that fit tightly around your waist and chest.  Do not use any products that contain nicotine or tobacco, such as cigarettes and e-cigarettes. If you need help quitting, ask your health care provider.  Sleep with the head of your bed raised. Use a wedge under the mattress or blocks under the bed frame to raise the head of the bed. What foods are not recommended? The items listed may not be a complete  list. Talk with your dietitian about what dietary choices are best for you. Grains Pastries or quick breads with added fat. French toast. Vegetables Deep fried vegetables. French fries. Any vegetables prepared with added fat. Any vegetables that cause symptoms. For some people this may include tomatoes and tomato products, chili peppers, onions and garlic, and horseradish. Fruits Any fruits prepared with added fat. Any fruits that cause symptoms. For some people this may include citrus fruits, such as oranges, grapefruit, pineapple, and lemons. Meats and other protein foods High-fat meats, such as fatty beef or pork, hot dogs, ribs, ham, sausage, salami and bacon. Fried meat or protein, including fried fish and fried chicken. Nuts and nut butters. Dairy Whole milk and chocolate milk. Sour cream. Cream. Ice cream. Cream cheese. Milk shakes. Beverages Coffee and tea, with or without caffeine. Carbonated beverages. Sodas. Energy drinks. Fruit juice made with acidic fruits (such as [place] or grapefruit). Tomato juice. Alcoholic drinks. Fats and oils Butter. Margarine. Shortening. Ghee. Sweets and desserts Chocolate and cocoa. Donuts. Seasoning and other foods Pepper. Peppermint and spearmint. Any condiments, herbs, or seasonings that cause symptoms. For some people, this may include curry, hot sauce, or vinegar-based salad dressings. Summary  When you have gastroesophageal reflux disease (GERD), food and lifestyle choices are very important to help ease the discomfort of GERD.  Eat frequent, small meals instead of three large meals each day. Eat your meals slowly, in a relaxed setting. Avoid bending over or lying down until 2-3 hours after eating.  Limit high-fat   foods such as fatty meat or fried foods. This information is not intended to replace advice given to you by your health care provider. Make sure you discuss any questions you have with your health care provider. Document Released:  08/06/2005 Document Revised: 08/07/2016 Document Reviewed: 08/07/2016 Elsevier Interactive Patient Education  2017 [employer]  Consider elevate head of bed 4-6 inches Consider trial of OTC Nexium or Prilosec 20 mg once daily Avoid mint/mentholated products Avoid eating within 2-3 hours of bedtime Follow up with primary if not improving in one month.

## 2017-07-24 MED FILL — VYVANSE 30 MG CAPSULE: 30 | 30 days supply | Qty: 30 | Fill #0

## 2017-07-25 DIAGNOSIS — Z79899 Other long term (current) drug therapy: Secondary | ICD-10-CM | POA: Diagnosis not present

## 2017-07-25 DIAGNOSIS — F902 Attention-deficit hyperactivity disorder, combined type: Secondary | ICD-10-CM | POA: Diagnosis not present

## 2017-08-22 MED FILL — VYVANSE 30 MG CAPSULE: 30 | 30 days supply | Qty: 30 | Fill #0

## 2017-09-04 MED FILL — LEVONOR-ETH ESTRAD 0.1-0.02: 0.1-20 | 84 days supply | Qty: 84 | Fill #1

## 2017-09-24 MED FILL — VYVANSE 30 MG CAPSULE: 30 | 30 days supply | Qty: 30 | Fill #0

## 2017-10-23 MED FILL — VYVANSE 30 MG CAPSULE: 30 | 30 days supply | Qty: 30 | Fill #0

## 2017-10-30 DIAGNOSIS — F902 Attention-deficit hyperactivity disorder, combined type: Secondary | ICD-10-CM | POA: Diagnosis not present

## 2017-10-30 DIAGNOSIS — Z79899 Other long term (current) drug therapy: Secondary | ICD-10-CM | POA: Diagnosis not present

## 2017-10-30 DIAGNOSIS — R05 Cough: Secondary | ICD-10-CM | POA: Diagnosis not present

## 2017-11-13 ENCOUNTER — Telehealth: Payer: 59 | Admitting: Family

## 2017-11-13 DIAGNOSIS — J069 Acute upper respiratory infection, unspecified: Secondary | ICD-10-CM | POA: Diagnosis not present

## 2017-11-13 MED ORDER — FLUTICASONE PROPIONATE 50 MCG/ACT NA SUSP: 2.0000 | Freq: Every day | NASAL | 6 refills | Status: DC

## 2017-11-13 MED ORDER — BENZONATATE 100 MG PO CAPS: 100.0000 mg | ORAL_CAPSULE | Freq: Three times a day (TID) | ORAL | 0 refills | Status: DC | PRN

## 2017-11-13 MED FILL — FLUTICASONE PROP 50 MCG SPR: 50 | 30 days supply | Qty: 16 | Fill #0

## 2017-11-13 MED FILL — BENZONATATE 100 MG CAPS: 100 | 6 days supply | Qty: 20 | Fill #0

## 2017-11-13 NOTE — Progress Notes (Signed)
We are sorry you are not feeling well.  Here is how we plan to help!  Based on what you have shared with me, it looks like you may have a viral upper respiratory infection or a "common cold".  Colds are caused by a large number of viruses; however, rhinovirus is the most common cause.   Symptoms of the common cold vary from person to person, with common symptoms including sore throat, cough, and malaise.  A low-grade fever of 100.4 may present, but is often uncommon.  Symptoms vary however, and are closely related to a person's age or underlying illnesses.  The most common symptoms associated with the common cold are nasal discharge or congestion, cough, sneezing, headache and pressure in the ears and face.  Cold symptoms usually persist for about 3 to 10 days, but can last up to 2 weeks.  It is important to know that colds do not cause serious illness or complications in most cases.    The common cold is transmitted from person to person, with the most common method of transmission being a person's hands.  The virus is able to live on the skin and can infect other persons for up to 2 hours after direct contact.  Also, colds are transmitted when someone coughs or sneezes; thus, it is important to cover the mouth to reduce this risk.  To keep the spread of the common cold at bay, good hand hygiene is very important.  This is an infection that is most likely caused by a virus. There are no specific treatments for the common cold other than to help you with the symptoms until the infection runs its course.    For nasal congestion, you may use an oral decongestants such as Mucinex D or if you have glaucoma or high blood pressure use plain Mucinex.  Saline nasal spray or nasal drops can help and can safely be used as often as needed for congestion.  For your congestion, I have prescribed Fluticasone nasal spray one spray in each nostril twice a day  If you do not have a history of heart disease, hypertension,  diabetes or thyroid disease, prostate/bladder issues or glaucoma, you may also use Sudafed to treat nasal congestion.  It is highly recommended that you consult with a pharmacist or your primary care physician to ensure this medication is safe for you to take.     If you have a cough, you may use cough suppressants such as Delsym and Robitussin.  If you have glaucoma or high blood pressure, you can also use Coricidin HBP.   For cough I have prescribed for you A prescription cough medication called Tessalon Perles 100 mg. You may take 1-2 capsules every 8 hours as needed for cough. This and the Flonase should be safe to take with your ADHD medications.  If you have a sore or scratchy throat, use a saltwater gargle-  to  teaspoon of salt dissolved in a 4-ounce to 8-ounce glass of warm water.  Gargle the solution for approximately 15-30 seconds and then spit.  It is important not to swallow the solution.  You can also use throat lozenges/cough drops and Chloraseptic spray to help with throat pain or discomfort.  Warm or cold liquids can also be helpful in relieving throat pain.  For headache, pain or general discomfort, you can use Ibuprofen or Tylenol as directed.   Some authorities believe that zinc sprays or the use of Echinacea may shorten the course of your  symptoms.   HOME CARE . Only take medications as instructed by your medical team. . Be sure to drink plenty of fluids. Water is fine as well as fruit juices, sodas and electrolyte beverages. You may want to stay away from caffeine or alcohol. If you are nauseated, try taking small sips of liquids. How do you know if you are getting enough fluid? Your urine should be a pale yellow or almost colorless. . Get rest. . Taking a steamy shower or using a humidifier may help nasal congestion and ease sore throat pain. You can place a towel over your head and breathe in the steam from hot water coming from a faucet. . Using a saline nasal spray works  much the same way. . Cough drops, hard candies and sore throat lozenges may ease your cough. . Avoid close contacts especially the very young and the elderly . Cover your mouth if you cough or sneeze . Always remember to wash your hands.   GET HELP RIGHT AWAY IF: . You develop worsening fever. . If your symptoms do not improve within 10 days . You become short of breath. . You develop yellow or green discharge from your nose over 3 days. . You have coughing fits . You develop a severe head ache or visual changes. . You develop shortness of breath or difficulty breathing. . Your symptoms persist after you have completed your treatment plan  MAKE SURE YOU   Understand these instructions.  Will watch your condition.  Will get help right away if you are not doing well or get worse.  Your e-visit answers were reviewed by a board certified advanced clinical practitioner to complete your personal care plan. Depending upon the condition, your plan could have included both over the counter or prescription medications. Please review your pharmacy choice. If there is a problem, you may call our nursing hot line at and have the prescription routed to another pharmacy. Your safety is important to [place]. If you have drug allergies check your prescription carefully.   You can use MyChart to ask questions about today's visit, request a non-urgent call back, or ask for a work or school excuse for 24 hours related to this e-Visit. If it has been greater than 24 hours you will need to follow up with your provider, or enter a new e-Visit to address those concerns. You will get an e-mail in the next two days asking about your experience.  I hope that your e-visit has been valuable and will speed your recovery. Thank you for using e-visits.

## 2017-11-22 MED FILL — VYVANSE 30 MG CAPSULE: 30 | 30 days supply | Qty: 30 | Fill #0

## 2017-11-22 MED FILL — LEVONOR-ETH ESTRAD 0.1-0.02: 0.1-20 | 84 days supply | Qty: 84 | Fill #2

## 2017-11-26 ENCOUNTER — Encounter: Payer: Self-pay | Admitting: Family Medicine

## 2017-11-26 ENCOUNTER — Ambulatory Visit: Payer: 59 | Admitting: Family Medicine

## 2017-11-26 VITALS — BP 110/78 | HR 90 | Temp 98.5°F | Ht 63.0 in | Wt 158.1 lb

## 2017-11-26 DIAGNOSIS — R05 Cough: Secondary | ICD-10-CM | POA: Diagnosis not present

## 2017-11-26 DIAGNOSIS — R053 Chronic cough: Secondary | ICD-10-CM

## 2017-11-26 NOTE — Patient Instructions (Signed)
I have made a referral to the Gastroenterology

## 2017-11-26 NOTE — Progress Notes (Signed)
Sandra Dawson - 22 y.o. female MRN [id number]  Date of birth: Sep 23, 1995  SUBJECTIVE:  Including CC & ROS.  Chief Complaint  Patient presents with  . Heartburn    Occassional heartburn and dry cough    Sandra Dawson is a 22 y.o. female that is presenting with a chronic cough. She feels the cough has been present. She denies a history of asthma. She does not notice a cough but her coworkers have. The cough is dry and nonproductive.    Review of Systems  Constitutional: Negative for fever.  Respiratory: Positive for cough. Negative for wheezing.   Cardiovascular: Negative for chest pain.    HISTORY: Past Medical, Surgical, Social, and Family History Reviewed & Updated per EMR.   Pertinent Historical Findings include:  Past Medical History:  Diagnosis Date  . ADHD (attention deficit hyperactivity disorder)   . History of chicken pox     Past Surgical History:  Procedure Laterality Date  . NO PAST SURGERIES      No Known Allergies  Family History  Problem Relation Age of Onset  . Cancer Other        family hx colon & lung cancer  . Diabetes Other   . Hyperlipidemia Other   . Heart attack Other   . Hyperlipidemia Father      Social History   Socioeconomic History  . Marital status: Single    Spouse name: Not on file  . Number of children: Not on file  . Years of education: Not on file  . Highest education level: Not on file  Occupational History  . Not on file  Social Needs  . Financial resource strain: Not on file  . Food insecurity:    Worry: Not on file    Inability: Not on file  . Transportation needs:    Medical: Not on file    Non-medical: Not on file  Tobacco Use  . Smoking status: Never Smoker  . Smokeless tobacco: Never Used  Substance and Sexual Activity  . Alcohol use: No  . Drug use: No  . Sexual activity: Never  Lifestyle  . Physical activity:    Days per week: Not on file    Minutes per session: Not on file  . Stress: Not on  file  Relationships  . Social connections:    Talks on phone: Not on file    Gets together: Not on file    Attends religious service: Not on file    Active member of club or organization: Not on file    Attends meetings of clubs or organizations: Not on file    Relationship status: Not on file  . Intimate partner violence:    Fear of current or ex partner: Not on file    Emotionally abused: Not on file    Physically abused: Not on file    Forced sexual activity: Not on file  Other Topics Concern  . Not on file  Social History Narrative   In college - Production designer, theatre/television/film regularly     PHYSICAL EXAM:  VS: BP 110/78 (BP Location: Left Arm, Patient Position: Sitting, Cuff Size: Normal)   Pulse 90   Temp 98.5 F (36.9 C) (Oral)   Ht 5\' 3"  (1.6 m)   Wt 158 lb 1.3 oz (71.7 kg)   SpO2 98%   BMI 28.00 kg/m  Physical Exam Gen: NAD, alert, cooperative with exam, well-appearing ENT: normal lips, normal nasal  mucosa,  Eye: normal EOM, normal conjunctiva and lids CV:  no edema, +2 pedal pulses, regular rate and rhythm, S1-S2   Resp: no accessory muscle use, non-labored, clear to auscultation bilaterally, no crackles or wheezes Skin: no rashes, no areas of induration  Neuro: normal tone, normal sensation to touch Psych:  normal insight, alert and oriented MSK: Normal gait, normal strength       ASSESSMENT & PLAN:   Chronic cough Possible for ongoing reflux. Has some improvement with prilosec with initial treatment  - referral to GI  - possible for underlying asthma. May need to refer for PFT's.

## 2017-11-27 NOTE — Assessment & Plan Note (Signed)
Possible for ongoing reflux. Has some improvement with prilosec with initial treatment  - referral to GI  - possible for underlying asthma. May need to refer for PFT's.

## 2017-12-03 ENCOUNTER — Encounter: Payer: Self-pay | Admitting: Gastroenterology

## 2017-12-23 MED FILL — VYVANSE 30 MG CAPSULE: 30 | 30 days supply | Qty: 30 | Fill #0

## 2017-12-24 ENCOUNTER — Encounter: Payer: Self-pay | Admitting: Gastroenterology

## 2017-12-24 ENCOUNTER — Ambulatory Visit: Payer: 59 | Admitting: Gastroenterology

## 2017-12-24 VITALS — BP 94/60 | HR 80 | Ht 63.25 in | Wt 162.5 lb

## 2017-12-24 DIAGNOSIS — K219 Gastro-esophageal reflux disease without esophagitis: Secondary | ICD-10-CM | POA: Diagnosis not present

## 2017-12-24 MED ORDER — PANTOPRAZOLE SODIUM 40 MG PO TBEC: 40.0000 mg | DELAYED_RELEASE_TABLET | Freq: Two times a day (BID) | ORAL | 11 refills | Status: DC

## 2017-12-24 MED FILL — PANTOPRAZOLE SOD DR 40 MG T: 40 | 30 days supply | Qty: 60 | Fill #0

## 2017-12-24 NOTE — Progress Notes (Signed)
History of Present Illness: This is a 22 year old female referred by Myra Rude, MD for the evaluation of throat clearing, cough, chest pain.  She is accompanied by her mother who is a Engineer, civil (consulting) at [employer].  Patient relates 3-1/2 years of frequent problems with throat clearing, dry cough and mild upper chest pain.  She does not have typical heartburn symptoms and denies dysphagia.  States she was treated with Nexium OTC daily for about 1 month per recommendation by Dr. Caryl Never in November 2018.  She noted a substantial improvement in her symptoms.  Her symptoms returned when she discontinued Nexium. Denies weight loss, abdominal pain, constipation, diarrhea, change in stool caliber, melena, hematochezia, nausea, vomiting, dysphagia, chest pain.   No Known Allergies Outpatient Medications Prior to Visit  Medication Sig Dispense Refill  . levonorgestrel-ethinyl estradiol (AVIANE,ALESSE,LESSINA) 0.1-20 MG-MCG tablet Take 1 tablet by mouth daily. 90 tablet 1  . lisdexamfetamine (VYVANSE) 30 MG capsule Take 30 mg by mouth daily.    . benzonatate (TESSALON PERLES) 100 MG capsule Take 1 capsule (100 mg total) by mouth 3 (three) times daily as needed. (Patient not taking: Reported on 11/26/2017) 20 capsule 0  . fluticasone (FLONASE) 50 MCG/ACT nasal spray Place 2 sprays into both nostrils daily. (Patient not taking: Reported on 11/26/2017) 16 g 6  . Multiple Vitamin (MULTIVITAMIN) tablet Take 1 tablet by mouth daily.     No facility-administered medications prior to visit.    Past Medical History:  Diagnosis Date  . ADHD (attention deficit hyperactivity disorder)   . History of chicken pox    Past Surgical History:  Procedure Laterality Date  . NO PAST SURGERIES     Social History   Socioeconomic History  . Marital status: Single    Spouse name: Not on file  . Number of children: 0  . Years of education: Not on file  . Highest education level: Not on file  Occupational History  .  Occupation: Management consultant  Social Needs  . Financial resource strain: Not on file  . Food insecurity:    Worry: Not on file    Inability: Not on file  . Transportation needs:    Medical: Not on file    Non-medical: Not on file  Tobacco Use  . Smoking status: Never Smoker  . Smokeless tobacco: Never Used  Substance and Sexual Activity  . Alcohol use: Yes    Comment: occasional  . Drug use: No  . Sexual activity: Never  Lifestyle  . Physical activity:    Days per week: Not on file    Minutes per session: Not on file  . Stress: Not on file  Relationships  . Social connections:    Talks on phone: Not on file    Gets together: Not on file    Attends religious service: Not on file    Active member of club or organization: Not on file    Attends meetings of clubs or organizations: Not on file    Relationship status: Not on file  Other Topics Concern  . Not on file  Social History Narrative   In college - Production designer, theatre/television/film regularly   Family History  Problem Relation Age of Onset  . Hyperlipidemia Father   . Diabetes Father   . Pulmonary fibrosis Maternal Grandmother   . Lung cancer Maternal Grandfather   . Dementia Maternal Grandfather   . Bladder Cancer Paternal Grandmother   .  Heart disease Paternal Grandfather   . Heart attack Paternal Grandfather        Review of Systems: Pertinent positive and negative review of systems were noted in the above HPI section. All other review of systems were otherwise negative.    Physical Exam: General: Well developed, well nourished, no acute distress Head: Normocephalic and atraumatic Eyes:  sclerae anicteric, EOMI Ears: Normal auditory acuity Mouth: No deformity or lesions Neck: Supple, no masses or thyromegaly Lungs: Clear throughout to auscultation Heart: Regular rate and rhythm; no murmurs, rubs or bruits Abdomen: Soft, non tender and non distended. No masses, hepatosplenomegaly or  hernias noted. Normal Bowel sounds Musculoskeletal: Symmetrical with no gross deformities  Skin: No lesions on visible extremities Pulses:  Normal pulses noted Extremities: No clubbing, cyanosis, edema or deformities noted Neurological: Alert oriented x 4, grossly nonfocal Cervical Nodes:  No significant cervical adenopathy Inguinal Nodes: No significant inguinal adenopathy Psychological:  Alert and cooperative. Normal mood and affect  Assessment and Recommendations:  1. 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  Begin pantoprazole 40 mg twice daily for the next 2 months.  Follow standard antireflux measures.  We discussed the option of proceeding with EGD for further evaluation however she prefers to defer endoscopy for now.  If symptoms do not resolve or recur after discontinuing medication she states she will be more interested in proceeding with endoscopy.  States she has a fear of needles and having an IV placed prior to an EGD is anxiety provoking. REV in 2 months.    cc: Myra Rude, MD [address] [place], [place] [address]

## 2017-12-24 NOTE — Patient Instructions (Signed)
We have sent the following medications to your pharmacy for you to pick up at your convenience: pantoprazole sodium 40 mg to take one tablet by mouth twice daily before breakfast and dinner.   Patient advised to avoid spicy, acidic, citrus, chocolate, mints, fruit and fruit juices.  Limit the intake of caffeine, alcohol and Soda.  Don't exercise too soon after eating.  Don't lie down within 3-4 hours of eating.  Elevate the head of your bed.  Call back in 3 weeks if your symptoms have not improved.   Normal BMI (Body Mass Index- based on height and weight) is between 19 and 25. Your BMI today is Body mass index is 28.56 kg/m. Marland Kitchen Please consider follow up  regarding your BMI with your Primary Care Provider.  Thank you for choosing me and [facility].  Venita Lick. Pleas Koch., MD., Clementeen Graham

## 2018-01-23 MED FILL — VYVANSE 30 MG CAPSULE: 30 | 30 days supply | Qty: 30 | Fill #0

## 2018-01-28 MED FILL — PANTOPRAZOLE SOD DR 40 MG T: 40 | 30 days supply | Qty: 60 | Fill #1

## 2018-01-30 ENCOUNTER — Encounter: Payer: Self-pay | Admitting: Internal Medicine

## 2018-02-05 DIAGNOSIS — F419 Anxiety disorder, unspecified: Secondary | ICD-10-CM | POA: Diagnosis not present

## 2018-02-05 DIAGNOSIS — F902 Attention-deficit hyperactivity disorder, combined type: Secondary | ICD-10-CM | POA: Diagnosis not present

## 2018-02-05 DIAGNOSIS — Z79899 Other long term (current) drug therapy: Secondary | ICD-10-CM | POA: Diagnosis not present

## 2018-02-19 MED FILL — LEVONOR-ETH ESTRAD 0.1-0.02: 0.1-20 | 84 days supply | Qty: 84 | Fill #0

## 2018-02-20 MED FILL — VYVANSE 30 MG CAPSULE: 30 | 14 days supply | Qty: 14 | Fill #0

## 2018-02-24 ENCOUNTER — Encounter: Payer: Self-pay | Admitting: Gastroenterology

## 2018-02-24 ENCOUNTER — Ambulatory Visit: Payer: 59 | Admitting: Gastroenterology

## 2018-02-24 VITALS — BP 108/64 | HR 70 | Ht 63.25 in | Wt 157.5 lb

## 2018-02-24 DIAGNOSIS — K219 Gastro-esophageal reflux disease without esophagitis: Secondary | ICD-10-CM

## 2018-02-24 NOTE — Patient Instructions (Signed)
If you are age 23 or older, your body mass index should be between 23-30. Your Body mass index is 27.68 kg/m. If this is out of the aforementioned range listed, please consider follow up with your Primary Care Provider.  If you are age 23 or younger, your body mass index should be between 19-25. Your Body mass index is 27.68 kg/m. If this is out of the aformentioned range listed, please consider follow up with your Primary Care Provider.   You may decrease your Pantoprazole 40mg  to once daily. Please contact your pharmacy when you need a refill.  Please follow up with Dr. Russella DarStark in one year.  Thank you.

## 2018-02-24 NOTE — Progress Notes (Signed)
    History of Present Illness: This is a 22 year old female returning for follow-up of GERD with suspected LPR.  All her symptoms have resolved on twice daily pantoprazole.  Current Medications, Allergies, Past Medical History, Past Surgical History, Family History and Social History were reviewed in [facility] electronic medical record.  Physical Exam: General: Well developed, well nourished, no acute distress Head: Normocephalic and atraumatic Eyes:  sclerae anicteric, EOMI Ears: Normal auditory acuity Mouth: No deformity or lesions Lungs: Clear throughout to auscultation Heart: Regular rate and rhythm; no murmurs, rubs or bruits Abdomen: Soft, non tender and non distended. No masses, hepatosplenomegaly or hernias noted. Normal Bowel sounds Rectal: Not done Musculoskeletal: Symmetrical with no gross deformities  Pulses:  Normal pulses noted Extremities: No clubbing, cyanosis, edema or deformities noted Neurological: Alert oriented x 4, grossly nonfocal Psychological:  Alert and cooperative. Normal mood and affect  Assessment and Recommendations:  1.  GERD with LPR.  We discussed long-term management.  Continue to follow antireflux measures long term.  Reduce pantoprazole to 40 mg every morning.  If symptoms are not well controlled patient is advised to resume pantoprazole twice daily.  She is advised to call if symptoms are not under good control. REV in 1 year.   I spent 15 minutes of face-to-face time with the patient. Greater than 50% of the time was spent counseling and coordinating care.

## 2018-03-04 MED FILL — VYVANSE 30 MG CAPSULE: 30 | 30 days supply | Qty: 30 | Fill #0

## 2018-03-06 DIAGNOSIS — H5213 Myopia, bilateral: Secondary | ICD-10-CM | POA: Diagnosis not present

## 2018-03-10 DIAGNOSIS — Z01419 Encounter for gynecological examination (general) (routine) without abnormal findings: Secondary | ICD-10-CM | POA: Diagnosis not present

## 2018-03-10 DIAGNOSIS — Z6827 Body mass index (BMI) 27.0-27.9, adult: Secondary | ICD-10-CM | POA: Diagnosis not present

## 2018-03-10 DIAGNOSIS — Z113 Encounter for screening for infections with a predominantly sexual mode of transmission: Secondary | ICD-10-CM | POA: Diagnosis not present

## 2018-03-19 ENCOUNTER — Ambulatory Visit: Payer: 59 | Admitting: Gastroenterology

## 2018-03-31 MED FILL — PANTOPRAZOLE SOD DR 40 MG T: 40 | 30 days supply | Qty: 60 | Fill #2

## 2018-04-07 MED FILL — VYVANSE 30 MG CAPSULE: 30 | 30 days supply | Qty: 30 | Fill #0

## 2018-05-08 MED FILL — VYVANSE 30 MG CAPSULE: 30 | 30 days supply | Qty: 30 | Fill #0

## 2018-05-12 DIAGNOSIS — F902 Attention-deficit hyperactivity disorder, combined type: Secondary | ICD-10-CM | POA: Diagnosis not present

## 2018-05-12 DIAGNOSIS — Z79899 Other long term (current) drug therapy: Secondary | ICD-10-CM | POA: Diagnosis not present

## 2018-05-15 MED FILL — LEVONOR-ETH ESTRAD 0.1-0.02: 0.1-20 | 84 days supply | Qty: 84 | Fill #0

## 2018-06-05 MED FILL — PANTOPRAZOLE SOD DR 40 MG T: 40 | 30 days supply | Qty: 60 | Fill #3

## 2018-06-05 MED FILL — VYVANSE 30 MG CAPSULE: 30 | 30 days supply | Qty: 30 | Fill #0

## 2018-07-24 MED FILL — KETOCONAZOLE 2% SHAMPOO: 2 | 30 days supply | Qty: 120 | Fill #0

## 2018-07-24 MED FILL — VYVANSE 30 MG CAPSULE: 30 | 30 days supply | Qty: 30 | Fill #0

## 2018-07-24 MED FILL — LEVONOR-ETH ESTRAD 0.1-0.02: 0.1-20 | 84 days supply | Qty: 84 | Fill #1

## 2018-08-01 ENCOUNTER — Ambulatory Visit: Payer: 59 | Admitting: Nurse Practitioner

## 2018-08-01 ENCOUNTER — Encounter: Payer: Self-pay | Admitting: Nurse Practitioner

## 2018-08-01 VITALS — BP 106/68 | HR 83 | Temp 98.0°F | Ht 63.25 in | Wt 148.0 lb

## 2018-08-01 DIAGNOSIS — H66001 Acute suppurative otitis media without spontaneous rupture of ear drum, right ear: Secondary | ICD-10-CM | POA: Diagnosis not present

## 2018-08-01 MED ORDER — CHLORPHEN-PE-ACETAMINOPHEN 4-10-325 MG PO TABS: 1.0000 | ORAL_TABLET | Freq: Two times a day (BID) | ORAL | 0 refills | Status: AC

## 2018-08-01 MED ORDER — CEFIXIME 400 MG PO CAPS
400.0000 mg | ORAL_CAPSULE | Freq: Every day | ORAL | 0 refills | Status: DC
Start: 2018-08-01 — End: 2019-07-22

## 2018-08-01 MED FILL — SUPRAX 400 MG CAPSULE: 400 | 10 days supply | Qty: 10 | Fill #0

## 2018-08-01 MED FILL — NOREL AD TABLET: 4-10-325 | 1 days supply | Qty: 4 | Fill #0

## 2018-08-01 NOTE — Progress Notes (Signed)
Subjective:  Patient ID: Sandra Dawson, female    DOB: 1995-11-23  Age: 22 y.o. MRN: [id number]  CC: Ear Pain (pt is complaining of ears pain down her jaw line,hx of ear infections/started yesterday. )  Otalgia   There is pain in the right ear. This is a new problem. The current episode started yesterday. The problem occurs constantly. The problem has been rapidly worsening. There has been no fever. Associated symptoms include hearing loss. Pertinent negatives include no ear discharge, neck pain, rhinorrhea or sore throat. She has tried nothing for the symptoms. Her past medical history is significant for a tympanostomy tube. There is no history of a chronic ear infection or hearing loss.    Reviewed past Medical, Social and Family history today.  Outpatient Medications Prior to Visit  Medication Sig Dispense Refill  . levonorgestrel-ethinyl estradiol (AVIANE,ALESSE,LESSINA) 0.1-20 MG-MCG tablet Take 1 tablet by mouth daily. 90 tablet 1  . lisdexamfetamine (VYVANSE) 30 MG capsule Take 30 mg by mouth daily.    . Multiple Vitamin (MULTIVITAMIN) tablet Take 1 tablet by mouth daily.    . pantoprazole (PROTONIX) 40 MG tablet Take 1 tablet (40 mg total) by mouth 2 (two) times daily before a meal. 60 tablet 11   No facility-administered medications prior to visit.     ROS See HPI  Objective:  BP 106/68   Pulse 83   Temp 98 F (36.7 C) (Oral)   Ht 5' 3.25" (1.607 m)   Wt 148 lb (67.1 kg)   SpO2 98%   BMI 26.01 kg/m   BP Readings from Last 3 Encounters:  08/01/18 106/68  02/24/18 108/64  12/24/17 94/60    Wt Readings from Last 3 Encounters:  08/01/18 148 lb (67.1 kg)  02/24/18 157 lb 8 oz (71.4 kg)  12/24/17 162 lb 8 oz (73.7 kg)    Physical Exam HENT:     Right Ear: Ear canal and external ear normal. Tenderness present. No drainage. A middle ear effusion is present. No foreign body. No mastoid tenderness. No PE tube. Tympanic membrane is injected, erythematous and  bulging. Tympanic membrane is not perforated.     Left Ear: Tympanic membrane, ear canal and external ear normal. No drainage or tenderness.  No middle ear effusion. There is no impacted cerumen. No foreign body. No mastoid tenderness. No PE tube. Tympanic membrane is not injected, perforated, erythematous or bulging.     Nose: Nose normal.     Mouth/Throat:     Mouth: Mucous membranes are moist.     Pharynx: Oropharynx is clear.     Tonsils: Swelling: 0 on the right. 0 on the left.  Neck:     Musculoskeletal: Normal range of motion and neck supple.  Cardiovascular:     Rate and Rhythm: Normal rate and regular rhythm.  Pulmonary:     Effort: Pulmonary effort is normal.     Breath sounds: Normal breath sounds.  Neurological:     Mental Status: She is alert.     Lab Results  Component Value Date   WBC 8.1 01/26/2014   HGB 14.2 01/26/2014   HCT 42.0 01/26/2014   PLT 309.0 01/26/2014   GLUCOSE 94 01/26/2014   CHOL 169 01/26/2014   TRIG 73.0 01/26/2014   HDL 62.00 01/26/2014   LDLCALC 92 01/26/2014   ALT 15 01/26/2014   AST 21 01/26/2014   NA 137 01/26/2014   K 4.4 01/26/2014   CL 103 01/26/2014   CREATININE 0.7 01/26/2014  BUN 15 01/26/2014   CO2 26 01/26/2014   TSH 2.02 01/26/2014    US Breast Ltd Uni Left Inc Axilla  Result Date: 08/17/2016 CLINICAL DATA:  22 year old patient with left lateral breast pain for 1-1/2 months. EXAM: ULTRASOUND OF THE LEFT BREAST COMPARISON:  None FINDINGS: On physical exam, the upper-outer and lower-outer quadrants of the left breast are soft to palpation. The patient's pain is predominantly along the periphery of the upper outer left breast. Targeted ultrasound is performed, showing normal fatty breast parenchyma. No solid or cystic mass or abnormal shadowing is identified to suggest malignancy. IMPRESSION: No sonographic evidence of malignancy in the left breast.  New RECOMMENDATION: Screening mammogram at age 67 unless there are persistent  or intervening clinical concerns. (Code:SM-B-40A) I have discussed the findings and recommendations with the patient. Results were also provided in writing at the conclusion of the visit. If applicable, a reminder letter will be sent to the patient regarding the next appointment. BI-RADS CATEGORY  1: Negative. Electronically Signed   By: Britta Mccreedy M.D.   On: 08/17/2016 17:24    Assessment & Plan:   Sandra Dawson was seen today for ear pain.  Diagnoses and all orders for this visit:  Non-recurrent acute suppurative otitis media of right ear without spontaneous rupture of tympanic membrane -     cefixime (SUPRAX) 400 MG CAPS capsule; Take 1 capsule (400 mg total) by mouth daily. -     Chlorphen-PE-Acetaminophen 4-10-325 MG TABS; Take 1 tablet by mouth every 12 (twelve) hours for 2 days.   I am having Sandra Dawson start on cefixime and Chlorphen-PE-Acetaminophen. I am also having her maintain her lisdexamfetamine, levonorgestrel-ethinyl estradiol, pantoprazole, and multivitamin.  Meds ordered this encounter  Medications  . cefixime (SUPRAX) 400 MG CAPS capsule    Sig: Take 1 capsule (400 mg total) by mouth daily.    Dispense:  10 capsule    Refill:  0    Order Specific Question:   Supervising Provider    Answer:   MATTHEWS, CODY [[id number]]  . Chlorphen-PE-Acetaminophen 4-10-325 MG TABS    Sig: Take 1 tablet by mouth every 12 (twelve) hours for 2 days.    Dispense:  4 tablet    Refill:  0    Order Specific Question:   Supervising Provider    Answer:   MATTHEWS, CODY [[id number]]    Problem List Items Addressed This Visit    None    Visit Diagnoses    Non-recurrent acute suppurative otitis media of right ear without spontaneous rupture of tympanic membrane    -  Primary   Relevant Medications   cefixime (SUPRAX) 400 MG CAPS capsule   Chlorphen-PE-Acetaminophen 4-10-325 MG TABS       Follow-up: No follow-ups on file.  Alysia Penna, NP

## 2018-08-01 NOTE — Patient Instructions (Signed)

## 2018-08-02 ENCOUNTER — Ambulatory Visit: Payer: 59 | Admitting: Family Medicine

## 2018-08-04 ENCOUNTER — Ambulatory Visit: Payer: 59 | Admitting: Nurse Practitioner

## 2018-08-04 ENCOUNTER — Encounter: Payer: Self-pay | Admitting: Nurse Practitioner

## 2018-08-04 VITALS — BP 112/74 | HR 71 | Temp 97.8°F | Ht 63.25 in | Wt 146.6 lb

## 2018-08-04 DIAGNOSIS — H60313 Diffuse otitis externa, bilateral: Secondary | ICD-10-CM | POA: Diagnosis not present

## 2018-08-04 DIAGNOSIS — H66001 Acute suppurative otitis media without spontaneous rupture of ear drum, right ear: Secondary | ICD-10-CM

## 2018-08-04 MED ORDER — CIPROFLOXACIN-DEXAMETHASONE 0.3-0.1 % OT SUSP: 4.0000 [drp] | Freq: Two times a day (BID) | OTIC | 0 refills | Status: AC

## 2018-08-04 MED ORDER — CETIRIZINE HCL 5 MG PO TABS
5.0000 mg | ORAL_TABLET | Freq: Every day | ORAL | Status: DC
Start: 2018-08-04 — End: 2020-03-25

## 2018-08-04 MED FILL — CIPRODEX OTIC SUSPENSION: 0.3-0.1 | 10 days supply | Qty: 8 | Fill #0

## 2018-08-04 NOTE — Progress Notes (Signed)
Subjective:  Patient ID: Sandra Dawson, female    DOB: Nov 08, 1995  Age: 22 y.o. MRN: [id number]  CC: Follow-up (pressure in ear,not better from last ov. took zyrtec today. )   Otalgia   There is pain in the right ear. This is a new problem. The current episode started in the past 7 days. The problem occurs constantly. The problem has been unchanged. There has been no fever. Associated symptoms include ear discharge and hearing loss. Pertinent negatives include no headaches, rash, rhinorrhea or sore throat. She has tried antibiotics for the symptoms. The treatment provided no relief.  no improvement in right ear pain. Started suprax on Friday. Denies any swimming recently.  Reviewed past Medical, Social and Family history today.  Outpatient Medications Prior to Visit  Medication Sig Dispense Refill  . cefixime (SUPRAX) 400 MG CAPS capsule Take 1 capsule (400 mg total) by mouth daily. 10 capsule 0  . levonorgestrel-ethinyl estradiol (AVIANE,ALESSE,LESSINA) 0.1-20 MG-MCG tablet Take 1 tablet by mouth daily. 90 tablet 1  . lisdexamfetamine (VYVANSE) 30 MG capsule Take 30 mg by mouth daily.    . Multiple Vitamin (MULTIVITAMIN) tablet Take 1 tablet by mouth daily.    . pantoprazole (PROTONIX) 40 MG tablet Take 1 tablet (40 mg total) by mouth 2 (two) times daily before a meal. 60 tablet 11   No facility-administered medications prior to visit.     ROS See HPI  Objective:  BP 112/74   Pulse 71   Temp 97.8 F (36.6 C) (Oral)   Ht 5' 3.25" (1.607 m)   Wt 146 lb 9.6 oz (66.5 kg)   SpO2 98%   BMI 25.76 kg/m   BP Readings from Last 3 Encounters:  08/04/18 112/74  08/01/18 106/68  02/24/18 108/64    Wt Readings from Last 3 Encounters:  08/04/18 146 lb 9.6 oz (66.5 kg)  08/01/18 148 lb (67.1 kg)  02/24/18 157 lb 8 oz (71.4 kg)    Physical Exam HENT:     Right Ear: External ear normal. Drainage and tenderness present. A middle ear effusion is present. There is no impacted  cerumen. No mastoid tenderness. Tympanic membrane is injected and erythematous. Tympanic membrane is not perforated.     Left Ear: External ear normal. Tenderness present. No drainage. A middle ear effusion is present. There is no impacted cerumen. No mastoid tenderness. Tympanic membrane is injected and erythematous. Tympanic membrane is not perforated.     Nose: Nose normal.  Neck:     Musculoskeletal: Normal range of motion.  Cardiovascular:     Rate and Rhythm: Normal rate.  Pulmonary:     Effort: Pulmonary effort is normal.  Lymphadenopathy:     Cervical: No cervical adenopathy.  Neurological:     Mental Status: She is alert.     Lab Results  Component Value Date   WBC 8.1 01/26/2014   HGB 14.2 01/26/2014   HCT 42.0 01/26/2014   PLT 309.0 01/26/2014   GLUCOSE 94 01/26/2014   CHOL 169 01/26/2014   TRIG 73.0 01/26/2014   HDL 62.00 01/26/2014   LDLCALC 92 01/26/2014   ALT 15 01/26/2014   AST 21 01/26/2014   NA 137 01/26/2014   K 4.4 01/26/2014   CL 103 01/26/2014   CREATININE 0.7 01/26/2014   BUN 15 01/26/2014   CO2 26 01/26/2014   TSH 2.02 01/26/2014    US Breast Ltd Uni Left Inc Axilla  Result Date: 08/17/2016 CLINICAL DATA:  22 year old patient with left  lateral breast pain for 1-1/2 months. EXAM: ULTRASOUND OF THE LEFT BREAST COMPARISON:  None FINDINGS: On physical exam, the upper-outer and lower-outer quadrants of the left breast are soft to palpation. The patient's pain is predominantly along the periphery of the upper outer left breast. Targeted ultrasound is performed, showing normal fatty breast parenchyma. No solid or cystic mass or abnormal shadowing is identified to suggest malignancy. IMPRESSION: No sonographic evidence of malignancy in the left breast.  New RECOMMENDATION: Screening mammogram at age 22 unless there are persistent or intervening clinical concerns. (Code:SM-B-40A) I have discussed the findings and recommendations with the patient. Results were  also provided in writing at the conclusion of the visit. If applicable, a reminder letter will be sent to the patient regarding the next appointment. BI-RADS CATEGORY  1: Negative. Electronically Signed   By: Britta MccreedySusan  Turner M.D.   On: 08/17/2016 17:24    Assessment & Plan:   Sandra Dawson was seen today for follow-up.  Diagnoses and all orders for this visit:  Non-recurrent acute suppurative otitis media of right ear without spontaneous rupture of tympanic membrane -     ciprofloxacin-dexamethasone (CIPRODEX) OTIC suspension; Place 4 drops into both ears 2 (two) times daily for 5 days. -     cetirizine (ZYRTEC) 5 MG tablet; Take 1 tablet (5 mg total) by mouth at bedtime. -     Ambulatory referral to ENT  Acute diffuse otitis externa of both ears -     ciprofloxacin-dexamethasone (CIPRODEX) OTIC suspension; Place 4 drops into both ears 2 (two) times daily for 5 days. -     cetirizine (ZYRTEC) 5 MG tablet; Take 1 tablet (5 mg total) by mouth at bedtime. -     Ambulatory referral to ENT   I am having Sandra Dawson start on ciprofloxacin-dexamethasone and cetirizine. I am also having her maintain her lisdexamfetamine, levonorgestrel-ethinyl estradiol, pantoprazole, multivitamin, and cefixime.  Meds ordered this encounter  Medications  . ciprofloxacin-dexamethasone (CIPRODEX) OTIC suspension    Sig: Place 4 drops into both ears 2 (two) times daily for 5 days.    Dispense:  7.5 mL    Refill:  0    Order Specific Question:   Supervising Provider    Answer:   Dianne DunARON, TALIA M [[id number]]  . cetirizine (ZYRTEC) 5 MG tablet    Sig: Take 1 tablet (5 mg total) by mouth at bedtime.    Order Specific Question:   Supervising Provider    Answer:   Dianne DunARON, TALIA M [[id number]]    Problem List Items Addressed This Visit    None    Visit Diagnoses    Non-recurrent acute suppurative otitis media of right ear without spontaneous rupture of tympanic membrane    -  Primary   Relevant Medications    ciprofloxacin-dexamethasone (CIPRODEX) OTIC suspension   cetirizine (ZYRTEC) 5 MG tablet   Other Relevant Orders   Ambulatory referral to ENT   Acute diffuse otitis externa of both ears       Relevant Medications   ciprofloxacin-dexamethasone (CIPRODEX) OTIC suspension   cetirizine (ZYRTEC) 5 MG tablet   Other Relevant Orders   Ambulatory referral to ENT      Follow-up: Return if symptoms worsen or fail to improve.  Alysia Pennaharlotte Shahzaib Azevedo, NP

## 2018-08-04 NOTE — Patient Instructions (Signed)
You will be contacted to schedule appt with ENT.   Otitis Externa Otitis externa is an infection of the outer ear canal. The outer ear canal is the area between the outside of the ear and the eardrum. Otitis externa is sometimes called "swimmer's ear." What are the causes? This condition may be caused by:  Swimming in dirty water.  Moisture in the ear.  An injury to the inside of the ear.  An object stuck in the ear.  A cut or scrape on the outside of the ear.  What increases the risk? This condition is more likely to develop in swimmers. What are the signs or symptoms? The first symptom of this condition is often itching in the ear. Later signs and symptoms include:  Swelling of the ear.  Redness in the ear.  Ear pain. The pain may get worse when you pull on your ear.  Pus coming from the ear.  How is this diagnosed? This condition may be diagnosed by examining the ear and testing fluid from the ear for bacteria and funguses. How is this treated? This condition may be treated with:  Antibiotic ear drops. These are often given for 10-14 days.  Medicine to reduce itching and swelling.  Follow these instructions at home:  If you were prescribed antibiotic ear drops, apply them as told by your health care provider. Do not stop using the antibiotic even if your condition improves.  Take over-the-counter and prescription medicines only as told by your health care provider.  Keep all follow-up visits as told by your health care provider. This is important. How is this prevented?  Keep your ear dry. Use the corner of a towel to dry your ear after you swim or bathe.  Avoid scratching or putting things in your ear. Doing these things can damage the ear canal or remove the protective wax that lines it, which makes it easier for bacteria and funguses to grow.  Avoid swimming in lakes, polluted water, or pools that may not have the right amount of chlorine.  Consider making  ear drops and putting 3 or 4 drops in each ear after you swim. Ask your health care provider about how you can make ear drops. Contact a health care provider if:  You have a fever.  After 3 days your ear is still red, swollen, painful, or draining pus.  Your redness, swelling, or pain gets worse.  You have a severe headache.  You have redness, swelling, pain, or tenderness in the area behind your ear. This information is not intended to replace advice given to you by your health care provider. Make sure you discuss any questions you have with your health care provider. Document Released: 08/06/2005 Document Revised: 09/13/2015 Document Reviewed: 05/16/2015 Elsevier Interactive Patient Education  [employer]2018 [employer].

## 2018-08-06 MED FILL — PANTOPRAZOLE SOD DR 40 MG T: 40 | 30 days supply | Qty: 60 | Fill #4

## 2018-08-18 DIAGNOSIS — F902 Attention-deficit hyperactivity disorder, combined type: Secondary | ICD-10-CM | POA: Diagnosis not present

## 2018-08-18 DIAGNOSIS — Z79899 Other long term (current) drug therapy: Secondary | ICD-10-CM | POA: Diagnosis not present

## 2018-08-18 MED FILL — VYVANSE 20 MG CAPSULE: 20 | 30 days supply | Qty: 30 | Fill #0

## 2018-09-30 MED FILL — VYVANSE 20 MG CAPSULE: 20 | 30 days supply | Qty: 30 | Fill #0

## 2018-10-30 MED FILL — VYVANSE 20 MG CAPSULE: 20 | 30 days supply | Qty: 30 | Fill #0

## 2018-10-30 MED FILL — LARISSIA 0.1-20 MG-MCG TABS: 0.1-20 | 84 days supply | Qty: 84 | Fill #2 | Status: TO

## 2018-11-10 MED FILL — PANTOPRAZOLE SOD DR 40 MG T: 40 | 30 days supply | Qty: 60 | Fill #0

## 2018-11-25 MED FILL — VYVANSE 20 MG CAPSULE: 20 | 30 days supply | Qty: 30 | Fill #0

## 2018-12-02 DIAGNOSIS — Z79899 Other long term (current) drug therapy: Secondary | ICD-10-CM | POA: Diagnosis not present

## 2018-12-02 DIAGNOSIS — F902 Attention-deficit hyperactivity disorder, combined type: Secondary | ICD-10-CM | POA: Diagnosis not present

## 2018-12-02 DIAGNOSIS — F419 Anxiety disorder, unspecified: Secondary | ICD-10-CM | POA: Diagnosis not present

## 2019-01-15 MED FILL — LARISSIA 0.1-20 MG-MCG TABS: 0.1-20 | 84 days supply | Qty: 84 | Fill #0

## 2019-02-02 ENCOUNTER — Other Ambulatory Visit: Payer: Self-pay | Admitting: Gastroenterology

## 2019-02-02 MED FILL — VYVANSE 20 MG CAPSULE: 20 | 30 days supply | Qty: 30 | Fill #0

## 2019-02-02 MED FILL — PANTOPRAZOLE SOD DR 40 MG T: 40 | 30 days supply | Qty: 60 | Fill #0

## 2019-03-03 DIAGNOSIS — F419 Anxiety disorder, unspecified: Secondary | ICD-10-CM | POA: Diagnosis not present

## 2019-03-03 DIAGNOSIS — F902 Attention-deficit hyperactivity disorder, combined type: Secondary | ICD-10-CM | POA: Diagnosis not present

## 2019-03-03 DIAGNOSIS — Z79899 Other long term (current) drug therapy: Secondary | ICD-10-CM | POA: Diagnosis not present

## 2019-03-26 MED FILL — VYVANSE 20 MG CAPSULE: 20 | 30 days supply | Qty: 30 | Fill #0

## 2019-04-07 ENCOUNTER — Other Ambulatory Visit: Payer: Self-pay | Admitting: Gastroenterology

## 2019-04-08 MED FILL — PANTOPRAZOLE SOD DR 40 MG T: 40 | 30 days supply | Qty: 60 | Fill #0

## 2019-04-08 MED FILL — LARISSIA 0.1-20 MG-MCG TABS: 0.1-20 | 84 days supply | Qty: 84 | Fill #0

## 2019-04-30 MED FILL — VYVANSE 20 MG CAPSULE: 20 | 30 days supply | Qty: 30 | Fill #0

## 2019-05-25 DIAGNOSIS — F902 Attention-deficit hyperactivity disorder, combined type: Secondary | ICD-10-CM | POA: Diagnosis not present

## 2019-05-25 DIAGNOSIS — F419 Anxiety disorder, unspecified: Secondary | ICD-10-CM | POA: Diagnosis not present

## 2019-05-25 DIAGNOSIS — Z79899 Other long term (current) drug therapy: Secondary | ICD-10-CM | POA: Diagnosis not present

## 2019-05-28 DIAGNOSIS — Z6826 Body mass index (BMI) 26.0-26.9, adult: Secondary | ICD-10-CM | POA: Diagnosis not present

## 2019-05-28 DIAGNOSIS — Z01419 Encounter for gynecological examination (general) (routine) without abnormal findings: Secondary | ICD-10-CM | POA: Diagnosis not present

## 2019-05-28 MED FILL — VYVANSE 20 MG CAPSULE: 20 | 30 days supply | Qty: 30 | Fill #0

## 2019-06-05 ENCOUNTER — Other Ambulatory Visit: Payer: Self-pay | Admitting: Gastroenterology

## 2019-06-08 ENCOUNTER — Other Ambulatory Visit: Payer: Self-pay | Admitting: Gastroenterology

## 2019-06-08 MED ORDER — PANTOPRAZOLE SODIUM 40 MG PO TBEC: 40.0000 mg | DELAYED_RELEASE_TABLET | Freq: Every day | ORAL | 1 refills | Status: DC

## 2019-06-08 MED FILL — PANTOPRAZOLE SOD DR 40 MG T: 40 | 30 days supply | Qty: 30 | Fill #0

## 2019-06-08 NOTE — Telephone Encounter (Signed)
Refilled Pantoprazole until December appt with Dr. Fuller Plan.

## 2019-06-08 NOTE — Telephone Encounter (Signed)
Pt is scheduled for OV 07/22/19 and requested a refill for pantoprazole.

## 2019-07-07 MED FILL — PANTOPRAZOLE SOD DR 40 MG T: 40 | 30 days supply | Qty: 30 | Fill #1

## 2019-07-08 MED FILL — LEVONOR-ETH ESTRAD 0.1-0.02: 0.1-20 | 84 days supply | Qty: 84 | Fill #0

## 2019-07-08 MED FILL — VYVANSE 20 MG CAPSULE: 20 | 30 days supply | Qty: 30 | Fill #0

## 2019-07-22 ENCOUNTER — Encounter: Payer: Self-pay | Admitting: Gastroenterology

## 2019-07-22 ENCOUNTER — Ambulatory Visit: Payer: 59 | Admitting: Gastroenterology

## 2019-07-22 VITALS — BP 104/60 | HR 88 | Temp 98.5°F | Ht 63.25 in | Wt 145.1 lb

## 2019-07-22 DIAGNOSIS — K219 Gastro-esophageal reflux disease without esophagitis: Secondary | ICD-10-CM

## 2019-07-22 MED ORDER — PANTOPRAZOLE SODIUM 40 MG PO TBEC: 40.0000 mg | DELAYED_RELEASE_TABLET | Freq: Every day | ORAL | 11 refills | Status: DC

## 2019-07-22 NOTE — Patient Instructions (Signed)
We have sent the following medications to your pharmacy for you to pick up at your convenience: pantoprazole.   Thank you for choosing me and [facility].  Malcolm T. Stark, Jr., MD., FACG   

## 2019-07-22 NOTE — Progress Notes (Signed)
    History of Present Illness: This is a 23 year old female with GERD suspected LPR.  Symptoms are well controlled on pantoprazole 40 mg daily.  She occasionally notes that when she takes pantoprazole but does not eat immediately she notes some mild nausea and dyspeptic symptoms that resolved immediately upon eating.  Current Medications, Allergies, Past Medical History, Past Surgical History, Family History and Social History were reviewed in [employer] record.  Physical Exam: General: Well developed, well nourished, no acute distress Head: Normocephalic and atraumatic Eyes:  sclerae anicteric, EOMI Ears: Normal auditory acuity Mouth: No deformity or lesions Lungs: Clear throughout to auscultation Heart: Regular rate and rhythm; no murmurs, rubs or bruits Abdomen: Soft, non tender and non distended. No masses, hepatosplenomegaly or hernias noted. Normal Bowel sounds Rectal: Not doe Musculoskeletal: Symmetrical with no gross deformities  Pulses:  Normal pulses noted Extremities: No clubbing, cyanosis, edema or deformities noted Neurological: Alert oriented x 4, grossly nonfocal Psychological:  Alert and cooperative. Normal mood and affect   Assessment and Recommendations:  1. GERD.  Follow standard antireflux measures.  Continue pantoprazole 40 mg daily.  Call if symptoms are not well controlled prior to annual visit.  Addressed several questions to her satisfaction.  REV in 1 year.   I spent 15 minutes of face-to-face time with the patient. Greater than 50% of the time was spent counseling and coordinating care.

## 2019-07-23 DIAGNOSIS — Z03818 Encounter for observation for suspected exposure to other biological agents ruled out: Secondary | ICD-10-CM | POA: Diagnosis not present

## 2019-08-04 DIAGNOSIS — Z03818 Encounter for observation for suspected exposure to other biological agents ruled out: Secondary | ICD-10-CM | POA: Diagnosis not present

## 2019-08-05 MED FILL — PANTOPRAZOLE SOD DR 40 MG T: 40 | 30 days supply | Qty: 30 | Fill #0

## 2019-08-06 MED FILL — VYVANSE 20 MG CAPSULE: 20 | 30 days supply | Qty: 30 | Fill #0

## 2019-08-18 DIAGNOSIS — Z79899 Other long term (current) drug therapy: Secondary | ICD-10-CM | POA: Diagnosis not present

## 2019-08-18 DIAGNOSIS — F419 Anxiety disorder, unspecified: Secondary | ICD-10-CM | POA: Diagnosis not present

## 2019-08-18 DIAGNOSIS — F902 Attention-deficit hyperactivity disorder, combined type: Secondary | ICD-10-CM | POA: Diagnosis not present

## 2019-09-03 MED FILL — VYVANSE 20 MG CAPSULE: 20 | 30 days supply | Qty: 30 | Fill #0

## 2019-09-03 MED FILL — PANTOPRAZOLE SOD DR 40 MG T: 40 | 90 days supply | Qty: 90 | Fill #0

## 2019-09-30 MED FILL — LARISSIA 0.1-20 MG-MCG TABS: 0.1-20 | 84 days supply | Qty: 84 | Fill #0

## 2019-10-28 ENCOUNTER — Telehealth: Payer: 59 | Admitting: Nurse Practitioner

## 2019-10-28 DIAGNOSIS — H6690 Otitis media, unspecified, unspecified ear: Secondary | ICD-10-CM | POA: Diagnosis not present

## 2019-10-28 MED ORDER — SULFAMETHOXAZOLE-TRIMETHOPRIM 800-160 MG PO TABS: 1.0000 | ORAL_TABLET | Freq: Two times a day (BID) | ORAL | 0 refills | Status: DC

## 2019-10-28 MED FILL — SULFAMETHOXAZOLE-TMP DS TAB: 800-160 | 10 days supply | Qty: 20 | Fill #0

## 2019-10-28 NOTE — Progress Notes (Signed)
E Visit for Swimmer's Ear  We are sorry that you are not feeling well. Here is how we plan to help!  Based on what you have shared with me it looks like you have swimmers ear. Swimmer's ear is a redness or swelling, irritation, or infection of your outer ear canal.  These symptoms usually occur within a few days of swimming.  Your ear canal is a tube that goes from the opening of the ear to the eardrum.  When water stays in your ear canal, germs can grow.  This is a painful condition that often happens to children and swimmers of all ages.  It is not contagious and oral antibiotics are not required to treat uncomplicated swimmer's ear.  The usual symptoms include: Itching inside the ear, Redness or a sense of swelling in the ear, Pain when the ear is tugged on when pressure is placed on the ear, Pus draining from the infected ear.  Based on what you have told me you may have a bacterial infection.  I have prescribed an oral antibiotic: and Bactrim 800mg /160mg  one tablet by mouth twice a day for 10 days  In certain cases swimmer's ear may progress to a more serious bacterial infection of the middle or inner ear.  If you have a fever 102 and up and significantly worsening symptoms, this could indicate a more serious infection moving to the middle/inner and needs face to face evaluation in an office by a provider.  Your symptoms should improve over the next 3 days and should resolve in about 7 days.  HOME CARE:   Wash your hands frequently.  Do not place the tip of the bottle on your ear or touch it with your fingers.  You can take Acetominophen 650 mg every 4-6 hours as needed for pain.  If pain is severe or moderate, you can apply a heating pad (set on low) or hot water bottle (wrapped in a towel) to outer ear for 20 minutes.  This will also increase drainage.  Avoid ear plugs  Do not use Q-tips  After showers, help the water run out by tilting your head to one side.  GET HELP RIGHT AWAY  IF:   Fever is over 102.2 degrees.  You develop progressive ear pain or hearing loss.  Ear symptoms persist longer than 3 days after treatment.  MAKE SURE YOU:   Understand these instructions.  Will watch your condition.  Will get help right away if you are not doing well or get worse.  TO PREVENT SWIMMER'S EAR:  Use a bathing cap or custom fitted swim molds to keep your ears dry.  Towel off after swimming to dry your ears.  Tilt your head or pull your earlobes to allow the water to escape your ear canal.  If there is still water in your ears, consider using a hairdryer on the lowest setting.  Thank you for choosing an e-visit. Your e-visit answers were reviewed by a board certified advanced clinical practitioner to complete your personal care plan. Depending upon the condition, your plan could have included both over the counter or prescription medications. Please review your pharmacy choice. Be sure that the pharmacy you have chosen is open so that you can pick up your prescription now.  If there is a problem you may message your provider in MyChart to have the prescription routed to another pharmacy. Your safety is important to . If you have drug allergies check your prescription carefully.  For the  next 24 hours, you can use MyChart to ask questions about today's visit, request a non-urgent call back, or ask for a work or school excuse from your e-visit provider. You will get an email in the next two days asking about your experience. I hope that your e-visit has been valuable and will speed your recovery.    5-10 minutes spent reviewing and documenting in chart.

## 2019-11-10 DIAGNOSIS — F902 Attention-deficit hyperactivity disorder, combined type: Secondary | ICD-10-CM | POA: Diagnosis not present

## 2019-11-10 DIAGNOSIS — Z79899 Other long term (current) drug therapy: Secondary | ICD-10-CM | POA: Diagnosis not present

## 2019-11-26 MED FILL — VYVANSE 20 MG CAPSULE: 20 | 30 days supply | Qty: 30 | Fill #0

## 2019-11-26 MED FILL — PANTOPRAZOLE SOD DR 40 MG T: 40 | 90 days supply | Qty: 90 | Fill #1

## 2019-12-25 MED FILL — LEVONOR-ETH ESTRAD 0.1-0.02: 0.1-20 | 84 days supply | Qty: 84 | Fill #0

## 2020-01-13 MED FILL — VYVANSE 20 MG CAPSULE: 20 | 30 days supply | Qty: 30 | Fill #0

## 2020-01-15 ENCOUNTER — Encounter: Payer: Self-pay | Admitting: Internal Medicine

## 2020-02-10 DIAGNOSIS — Z79899 Other long term (current) drug therapy: Secondary | ICD-10-CM | POA: Diagnosis not present

## 2020-02-10 DIAGNOSIS — F419 Anxiety disorder, unspecified: Secondary | ICD-10-CM | POA: Diagnosis not present

## 2020-02-10 DIAGNOSIS — F902 Attention-deficit hyperactivity disorder, combined type: Secondary | ICD-10-CM | POA: Diagnosis not present

## 2020-02-10 MED FILL — VYVANSE 20 MG CAPSULE: 20 | 30 days supply | Qty: 30 | Fill #0

## 2020-03-03 MED FILL — PANTOPRAZOLE SOD DR 40 MG T: 40 | 90 days supply | Qty: 90 | Fill #0

## 2020-03-17 MED FILL — LARISSIA 0.1-20 MG-MCG TABS: 0.1-20 | 84 days supply | Qty: 84 | Fill #0

## 2020-03-25 ENCOUNTER — Telehealth (INDEPENDENT_AMBULATORY_CARE_PROVIDER_SITE_OTHER): Payer: 59 | Admitting: Physician Assistant

## 2020-03-25 ENCOUNTER — Other Ambulatory Visit: Payer: Self-pay

## 2020-03-25 ENCOUNTER — Encounter: Payer: Self-pay | Admitting: Physician Assistant

## 2020-03-25 DIAGNOSIS — H6691 Otitis media, unspecified, right ear: Secondary | ICD-10-CM | POA: Diagnosis not present

## 2020-03-25 MED ORDER — AMOXICILLIN 875 MG PO TABS: 875.0000 mg | ORAL_TABLET | Freq: Two times a day (BID) | ORAL | 0 refills | Status: DC

## 2020-03-25 MED FILL — AMOXICILLIN 875 MG TABS: 875 | 10 days supply | Qty: 20 | Fill #0

## 2020-03-25 NOTE — Progress Notes (Signed)
   Virtual Visit via Video   I connected with Sandra Dawson on 03/25/20 at  3:00 PM EDT by a video enabled telemedicine application and verified that I am speaking with the correct person using two identifiers.  Location Sandra Dawson: Home Location provider: [facility] [facility], Office Persons participating in the virtual visit: Sandra Dawson, Provider, CMA (Patina Moore)  I discussed the limitations of evaluation and management by telemedicine and the availability of in person appointments. The Sandra Dawson expressed understanding and agreed to proceed.  Subjective:   HPI:   Sandra Dawson presents via [facility] today c/o 1-2 days of pain of her R ear. Sandra Dawson endorses pain is a constant dull ache but occasionally will more intense. Denies pain with wiggling earlobe. Denies sinus pain, tooth pain, chest congestion or SOB. Denies recent travel or sick contact. Has noted temperature running 99.7.   ROS:   See pertinent positives and negatives per HPI.  Sandra Dawson Active Problem List   Diagnosis Date Noted  . Chronic cough 11/26/2017  . Neck pain, musculoskeletal 02/18/2017  . Obese   . ADD (attention deficit disorder)     Social History   Tobacco Use  . Smoking status: Never Smoker  . Smokeless tobacco: Never Used  Substance Use Topics  . Alcohol use: Yes    Comment: occasional    Current Outpatient Medications:  .  levonorgestrel-ethinyl estradiol (AVIANE,ALESSE,LESSINA) 0.1-20 MG-MCG tablet, Take 1 tablet by mouth daily., Disp: 90 tablet, Rfl: 1 .  Multiple Vitamin (MULTIVITAMIN) tablet, Take 1 tablet by mouth daily., Disp: , Rfl:  .  pantoprazole (PROTONIX) 40 MG tablet, Take 1 tablet (40 mg total) by mouth daily., Disp: 30 tablet, Rfl: 11 .  VYVANSE 20 MG capsule, Take 20 mg by mouth daily., Disp: , Rfl:   No Known Allergies  Objective:   There were no vitals taken for this visit.  Sandra Dawson is well-developed, well-nourished in no acute distress.  Resting comfortably at home.  Head is  normocephalic, atraumatic.  No labored breathing.  Speech is clear and coherent with logical content.  Sandra Dawson is alert and oriented at baseline.  No pain with wiggling ear lobe. Pain with pushing the tragus in.  Assessment and Plan:   1. Right otitis media, unspecified otitis media type Rx Amoxicillin. Supportive measures and OTC medications reviewed. Follow-up with PCP if symptoms not resolving. May benefit from ENT follow-up.  - amoxicillin (AMOXIL) 875 MG tablet; Take 1 tablet (875 mg total) by mouth 2 (two) times daily.  Dispense: 20 tablet; Refill: 0 .   Piedad Climes, PA-C 03/25/2020

## 2020-03-25 NOTE — Progress Notes (Signed)
I have discussed the procedure for the virtual visit with the patient who has given consent to proceed with assessment and treatment.   Sandra Dawson Stur[place], CMA     

## 2020-04-11 MED FILL — VYVANSE 20 MG CAPSULE: 20 | 30 days supply | Qty: 30 | Fill #0

## 2020-05-09 DIAGNOSIS — F419 Anxiety disorder, unspecified: Secondary | ICD-10-CM | POA: Diagnosis not present

## 2020-05-09 DIAGNOSIS — Z79899 Other long term (current) drug therapy: Secondary | ICD-10-CM | POA: Diagnosis not present

## 2020-05-09 DIAGNOSIS — F902 Attention-deficit hyperactivity disorder, combined type: Secondary | ICD-10-CM | POA: Diagnosis not present

## 2020-05-09 MED FILL — VYVANSE 20 MG CAPSULE: 20 | 30 days supply | Qty: 30 | Fill #0

## 2020-05-25 MED FILL — VYVANSE 20 MG CAPSULE: 20 | 30 days supply | Qty: 30 | Fill #0

## 2020-06-02 ENCOUNTER — Other Ambulatory Visit (HOSPITAL_COMMUNITY): Payer: Self-pay | Admitting: Radiology

## 2020-06-02 MED FILL — PANTOPRAZOLE SOD DR 40 MG T: 40 | 90 days supply | Qty: 90 | Fill #2

## 2020-06-02 MED FILL — LEVONOR-ETH ESTRAD 0.1-0.02: 0.1-20 | 84 days supply | Qty: 84 | Fill #0

## 2020-06-11 ENCOUNTER — Encounter (HOSPITAL_COMMUNITY): Payer: Self-pay | Admitting: *Deleted

## 2020-06-11 ENCOUNTER — Other Ambulatory Visit: Payer: Self-pay

## 2020-06-11 ENCOUNTER — Ambulatory Visit (HOSPITAL_COMMUNITY)
Admission: EM | Admit: 2020-06-11 | Discharge: 2020-06-11 | Disposition: A | Discharge status: Home / Self Care | Payer: 59 | Attending: Family Medicine | Admitting: Family Medicine

## 2020-06-11 DIAGNOSIS — Z23 Encounter for immunization: Secondary | ICD-10-CM | POA: Diagnosis not present

## 2020-06-11 DIAGNOSIS — T148XXA Other injury of unspecified body region, initial encounter: Secondary | ICD-10-CM | POA: Diagnosis not present

## 2020-06-11 HISTORY — DX: Gastro-esophageal reflux disease without esophagitis: K21.9

## 2020-06-11 MED ORDER — TETANUS-DIPHTH-ACELL PERTUSSIS 5-2.5-18.5 LF-MCG/0.5 IM SUSP
0.5000 mL | Freq: Once | INTRAMUSCULAR | Status: AC
  Administered 2020-06-11: 0.5 mL via INTRAMUSCULAR

## 2020-06-11 MED ORDER — TETANUS-DIPHTH-ACELL PERTUSSIS 5-2.5-18.5 LF-MCG/0.5 IM SUSP
INTRAMUSCULAR | Status: AC
  Filled 2020-06-11: qty 0.5

## 2020-06-11 NOTE — ED Triage Notes (Signed)
Pt reports playing with friend's puppy when the puppy bit patient playfully to right wrist area yesterday.  Very superficial tiny scratches noted.  Pt's friend is locating rabies vaccine proof, but states dog is up to date; pt understands that if the puppy is found to not be up to date that she would need to go to ED.

## 2020-06-11 NOTE — ED Provider Notes (Signed)
[facility]    CSN: [id number] Arrival date & time: 06/11/20  1532      History   Chief Complaint Chief Complaint  Patient presents with  . Animal Bite    HPI Sandra Dawson is a 24 y.o. female.   Patient presenting today for eval of dog bite that happened yesterday evening. She was playing with a friend's yorkie puppy when it play-bit her right forearm and left a small scratch. States it did bleed a bit afterward but otherwise no significant pain, redness, drainage. Has not put anything on it yet. UTD on rabies but she is waiting to see records for verification. Last Tdap 2008.      Past Medical History:  Diagnosis Date  . Acid reflux   . ADHD (attention deficit hyperactivity disorder)   . History of chicken pox     Patient Active Problem List   Diagnosis Date Noted  . Chronic cough 11/26/2017  . Neck pain, musculoskeletal 02/18/2017  . Obese   . ADD (attention deficit disorder)     Past Surgical History:  Procedure Laterality Date  . MYRINGOTOMY WITH TUBE PLACEMENT    . NO PAST SURGERIES      OB History   No obstetric history on file.      Home Medications    Prior to Admission medications   Medication Sig Start Date End Date Taking? Authorizing Provider  levonorgestrel-ethinyl estradiol (AVIANE,ALESSE,LESSINA) 0.1-20 MG-MCG tablet Take 1 tablet by mouth daily. 10/25/15  Yes Burns, Bobette Mo, MD  Multiple Vitamin (MULTIVITAMIN) tablet Take 1 tablet by mouth daily.   Yes [provider]  pantoprazole (PROTONIX) 40 MG tablet Take 1 tablet (40 mg total) by mouth daily. 07/22/19  Yes Meryl Dare, MD  VYVANSE 20 MG capsule Take 20 mg by mouth daily. 02/10/20  Yes [provider]  amoxicillin (AMOXIL) 875 MG tablet Take 1 tablet (875 mg total) by mouth 2 (two) times daily. 03/25/20   Waldon Merl, PA-C    Family History Family History  Problem Relation Age of Onset  . Hyperlipidemia Father   . Diabetes Father   .  Pulmonary fibrosis Maternal Grandmother   . Lung cancer Maternal Grandfather   . Dementia Maternal Grandfather   . Bladder Cancer Paternal Grandmother   . Heart disease Paternal Grandfather   . Heart attack Paternal Grandfather   . Healthy Mother     Social History Social History   Tobacco Use  . Smoking status: Never Smoker  . Smokeless tobacco: Never Used  Vaping Use  . Vaping Use: Never used  Substance Use Topics  . Alcohol use: Yes    Alcohol/week: 3.0 standard drinks    Types: 3 Cans of beer per week  . Drug use: Never     Allergies   Patient has no known allergies.   Review of Systems Review of Systems PER HPI    Physical Exam Triage Vital Signs ED Triage Vitals [06/11/20 1610]  Enc Vitals Group     BP 130/70     Pulse Rate 87     Resp 16     Temp 99.3 F (37.4 C)     Temp Source Oral     SpO2 97 %     Weight      Height      Head Circumference      Peak Flow      Pain Score 0     Pain Loc  Pain Edu?      Excl. in GC?    No data found.  Updated Vital Signs BP 130/70   Pulse 87   Temp 99.3 F (37.4 C) (Oral)   Resp 16   LMP 06/08/2020 (Exact Date)   SpO2 97%   Visual Acuity Right Eye Distance:   Left Eye Distance:   Bilateral Distance:    Right Eye Near:   Left Eye Near:    Bilateral Near:     Physical Exam Vitals and nursing note reviewed.  Constitutional:      Appearance: Normal appearance. She is not ill-appearing.  HENT:     Head: Atraumatic.  Eyes:     Extraocular Movements: Extraocular movements intact.     Conjunctiva/sclera: Conjunctivae normal.  Cardiovascular:     Rate and Rhythm: Normal rate and regular rhythm.     Heart sounds: Normal heart sounds.  Pulmonary:     Effort: Pulmonary effort is normal.     Breath sounds: Normal breath sounds.  Musculoskeletal:        General: Normal range of motion.     Cervical back: Normal range of motion and neck supple.  Skin:    General: Skin is warm.     Comments:  Very superficial 1 cm linear abrasion to right forearm. No erythema, drainage, edema  Neurological:     Mental Status: She is alert and oriented to person, place, and time.  Psychiatric:        Mood and Affect: Mood normal.        Thought Content: Thought content normal.        Judgment: Judgment normal.     UC Treatments / Results  Labs (all labs ordered are listed, but only abnormal results are displayed) Labs Reviewed - No data to display  EKG   Radiology No results found.  Procedures Procedures (including critical care time)  Medications Ordered in UC Medications  Tdap (BOOSTRIX) injection 0.5 mL (has no administration in time range)    Initial Impression / Assessment and Plan / UC Course  I have reviewed the triage vital signs and the nursing notes.  Pertinent labs & imaging results that were available during my care of the patient were reviewed by me and considered in my medical decision making (see chart for details).     Wound quite superficial, no evidence of infection. Wound care reviewed with neosporin BID, frequent washing, keep covered. Tdap renewed today. Continue to monitor until fully healed.   Final Clinical Impressions(s) / UC Diagnoses   Final diagnoses:  Skin abrasion   Discharge Instructions   None    ED Prescriptions    None     PDMP not reviewed this encounter.   Particia Nearing, [place] 06/11/20 1727

## 2020-07-07 ENCOUNTER — Other Ambulatory Visit (HOSPITAL_COMMUNITY): Payer: Self-pay | Admitting: Internal Medicine

## 2020-07-07 MED FILL — VYVANSE 20 MG CAPSULE: 20 | 30 days supply | Qty: 30 | Fill #0

## 2020-08-09 ENCOUNTER — Other Ambulatory Visit (HOSPITAL_COMMUNITY): Payer: Self-pay | Admitting: Internal Medicine

## 2020-08-09 DIAGNOSIS — Z79899 Other long term (current) drug therapy: Secondary | ICD-10-CM | POA: Diagnosis not present

## 2020-08-09 DIAGNOSIS — F902 Attention-deficit hyperactivity disorder, combined type: Secondary | ICD-10-CM | POA: Diagnosis not present

## 2020-08-09 DIAGNOSIS — F419 Anxiety disorder, unspecified: Secondary | ICD-10-CM | POA: Diagnosis not present

## 2020-08-09 MED FILL — VYVANSE 30 MG CAPSULE: 30 | 30 days supply | Qty: 30 | Fill #0

## 2020-08-22 ENCOUNTER — Other Ambulatory Visit (HOSPITAL_COMMUNITY): Payer: Self-pay | Admitting: Radiology

## 2020-08-22 DIAGNOSIS — Z01419 Encounter for gynecological examination (general) (routine) without abnormal findings: Secondary | ICD-10-CM | POA: Diagnosis not present

## 2020-08-22 DIAGNOSIS — Z3040 Encounter for surveillance of contraceptives, unspecified: Secondary | ICD-10-CM | POA: Diagnosis not present

## 2020-08-22 DIAGNOSIS — Z6826 Body mass index (BMI) 26.0-26.9, adult: Secondary | ICD-10-CM | POA: Diagnosis not present

## 2020-08-22 MED FILL — LEVONOR-ETH ESTRAD 0.1-0.02: 0.1-20 | 84 days supply | Qty: 84 | Fill #0

## 2020-08-29 DIAGNOSIS — Z01419 Encounter for gynecological examination (general) (routine) without abnormal findings: Secondary | ICD-10-CM | POA: Diagnosis not present

## 2020-08-30 ENCOUNTER — Other Ambulatory Visit: Payer: Self-pay | Admitting: Gastroenterology

## 2020-09-01 ENCOUNTER — Telehealth: Payer: Self-pay | Admitting: Gastroenterology

## 2020-09-01 ENCOUNTER — Telehealth: Payer: 59 | Admitting: Physician Assistant

## 2020-09-01 DIAGNOSIS — H938X9 Other specified disorders of ear, unspecified ear: Secondary | ICD-10-CM | POA: Diagnosis not present

## 2020-09-01 MED ORDER — FLUTICASONE PROPIONATE 50 MCG/ACT NA SUSP
2.0000 | Freq: Every day | NASAL | 0 refills | Status: DC
Start: 2020-09-01 — End: 2020-09-29

## 2020-09-01 NOTE — Telephone Encounter (Signed)
Patient calling to request refill on Pantoprazole states pharmacy told her the previous request was denied and she would like to know why.

## 2020-09-01 NOTE — Progress Notes (Signed)
E Visit for Ear Problem  We are sorry that you are not feeling well. Here is how we plan to help!  Sounds like you have inflammation surrounding your eustachian tube. The eustachian tube is a tube that connects the middle ear (the part of the ear behind the eardrum) to the back of the nose and throat.  Normally, the eustachian tube helps keep the air pressure inside the middle ear the same as the air pressure outside the middle ear. If there is a problem with the eustachian tube, the air pressure inside the middle ear won't be the same as the air pressure outside of it. It may feel like a "popping".   Common symptoms of a eustachian tube problem include: ?Ear pain ?Feeling pressure or fullness in the ear ?Trouble hearing ?Ringing in the ear ?Feeling dizzy  I have prescribed Flonase nasal spray: 1-2 sprays in both nostrils every 12 hours.  Stay well hydrated.   Your symptoms should improve over the next 3 days and should resolve in about 7 days.  HOME CARE:  ?Nose sprays ?Antihistamines - These medicines are usually used to treat allergies. They help stop itching, sneezing, and runny nose symptoms. ?Decongestants - These medicines can help with stuffy nose symptoms. ?Surgery - Most people do not need surgery for eustachian tube problems. But people might need surgery if their symptoms don't get better with medicines or they have severe or long-term symptoms. Antibiotics are not needed to treat eustachian tube problems. But they might be needed if a person has an ear infection.    GET HELP RIGHT AWAY IF:   You develop a fever over 102.2 degrees.  You develop progressive ear pain or hearing loss.  Ear symptoms persist longer than 3 days after treatment.  MAKE SURE YOU:   Understand these instructions.  Will watch your condition.  Will get help right away if you are not doing well or get worse.    Thank you for choosing an e-visit. Your e-visit answers were reviewed by  a board certified advanced clinical practitioner to complete your personal care plan. Depending upon the condition, your plan could have included both over the counter or prescription medications. Please review your pharmacy choice. Be sure that the pharmacy you have chosen is open so that you can pick up your prescription now.  If there is a problem you may message your provider in MyChart to have the prescription routed to another pharmacy. Your safety is important to [place]. If you have drug allergies check your prescription carefully.  For the next 24 hours, you can use MyChart to ask questions about today's visit, request a non-urgent call back, or ask for a work or school excuse from your e-visit provider. You will get an email in the next two days asking about your experience. I hope that your e-visit has been valuable and will speed your recovery.      Greater than 5 minutes, yet less than 10 minutes of time have been spent researching, coordinating and implementing care for this patient today.

## 2020-09-02 ENCOUNTER — Other Ambulatory Visit (HOSPITAL_COMMUNITY): Payer: Self-pay | Admitting: Gastroenterology

## 2020-09-02 MED ORDER — PANTOPRAZOLE SODIUM 40 MG PO TBEC: 40.0000 mg | DELAYED_RELEASE_TABLET | Freq: Every day | ORAL | 1 refills | Status: DC

## 2020-09-02 MED FILL — PANTOPRAZOLE SOD DR 40 MG T: 40 | 30 days supply | Qty: 30 | Fill #0

## 2020-09-02 NOTE — Telephone Encounter (Signed)
Informed patient that she is over due for appt and I can send in the medication until her appt. Pt scheduled appt on 09/29/20 at 11:00am. Prescription sent.

## 2020-09-27 ENCOUNTER — Other Ambulatory Visit (HOSPITAL_COMMUNITY): Payer: Self-pay | Admitting: Internal Medicine

## 2020-09-27 MED FILL — VYVANSE 30 MG CAPSULE: 30 | 30 days supply | Qty: 30 | Fill #0

## 2020-09-27 MED FILL — PANTOPRAZOLE SOD DR 40 MG T: 40 | 30 days supply | Qty: 30 | Fill #1

## 2020-09-29 ENCOUNTER — Encounter: Payer: Self-pay | Admitting: Gastroenterology

## 2020-09-29 ENCOUNTER — Other Ambulatory Visit: Payer: Self-pay

## 2020-09-29 ENCOUNTER — Other Ambulatory Visit (HOSPITAL_COMMUNITY): Payer: Self-pay | Admitting: Gastroenterology

## 2020-09-29 ENCOUNTER — Ambulatory Visit: Payer: 59 | Admitting: Gastroenterology

## 2020-09-29 VITALS — BP 102/60 | HR 86 | Ht 63.0 in | Wt 147.0 lb

## 2020-09-29 DIAGNOSIS — K219 Gastro-esophageal reflux disease without esophagitis: Secondary | ICD-10-CM

## 2020-09-29 MED ORDER — PANTOPRAZOLE SODIUM 40 MG PO TBEC: 40.0000 mg | DELAYED_RELEASE_TABLET | Freq: Every day | ORAL | 11 refills | Status: DC

## 2020-09-29 NOTE — Progress Notes (Signed)
    History of Present Illness: This is a 25 year old female returning for follow-up of GERD.  Symptoms well controlled with antireflux measures and pantoprazole daily.  She has infrequent and mild episodes of heartburn.  No other gastrointestinal complaints.  She is planning to relocate to [place] in the next few months.  Current Medications, Allergies, Past Medical History, Past Surgical History, Family History and Social History were reviewed in [employer] record.   Physical Exam: General: Well developed, well nourished, no acute distress Head: Normocephalic and atraumatic Eyes: Sclerae anicteric, EOMI Ears: Normal auditory acuity Mouth: Not examined, mask on during Covid-19 pandemic Lungs: Clear throughout to auscultation Heart: Regular rate and rhythm; no murmurs, rubs or bruits Abdomen: Soft, non tender and non distended. No masses, hepatosplenomegaly or hernias noted. Normal Bowel sounds Rectal:  Not done Musculoskeletal: Symmetrical with no gross deformities  Pulses:  Normal pulses noted Extremities: No clubbing, cyanosis, edema or deformities noted Neurological: Alert oriented x 4, grossly nonfocal Psychological:  Alert and cooperative. Normal mood and affect   Assessment and Recommendations:  1.  GERD.  Follow antireflux measures and continue pantoprazole 40 mg daily.  Return in 1 year to me, her PCP or new gastroenterologist in [place].  She was provided Alger Simons, MD and Marty Heck, MD for GI follow-up as needed in [place].

## 2020-09-29 NOTE — Patient Instructions (Signed)
We have sent the following medications to your pharmacy for you to pick up at your convenience: protonix.   Some recommendations of Gastrointestinal physicians in [place] are: Janine Ores, MD Alger Simons, MD   Thank you for choosing me and [facility].  Venita Lick. Pleas Koch., MD., Clementeen Graham

## 2020-10-27 MED FILL — PANTOPRAZOLE SOD DR 40 MG T: 40 | 90 days supply | Qty: 90 | Fill #0

## 2020-10-28 ENCOUNTER — Other Ambulatory Visit (HOSPITAL_COMMUNITY): Payer: Self-pay | Admitting: Internal Medicine

## 2020-10-28 MED FILL — VYVANSE 30 MG CAPSULE: 30 | 30 days supply | Qty: 30 | Fill #0

## 2020-11-22 ENCOUNTER — Other Ambulatory Visit (HOSPITAL_COMMUNITY): Payer: Self-pay

## 2020-11-22 MED FILL — Levonorgestrel & Ethinyl Estradiol Tab 0.1 MG-20 MCG: ORAL | 84 days supply | Qty: 84 | Fill #0 | Status: AC
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
## 2020-12-06 ENCOUNTER — Other Ambulatory Visit: Payer: Self-pay

## 2020-12-06 ENCOUNTER — Other Ambulatory Visit (HOSPITAL_COMMUNITY): Payer: Self-pay

## 2020-12-07 ENCOUNTER — Other Ambulatory Visit (HOSPITAL_COMMUNITY): Payer: Self-pay

## 2020-12-07 ENCOUNTER — Other Ambulatory Visit: Payer: Self-pay

## 2020-12-07 ENCOUNTER — Encounter: Payer: Self-pay | Admitting: Psychiatry

## 2020-12-07 ENCOUNTER — Ambulatory Visit (INDEPENDENT_AMBULATORY_CARE_PROVIDER_SITE_OTHER): Payer: Self-pay | Admitting: Psychiatry

## 2020-12-07 DIAGNOSIS — F422 Mixed obsessional thoughts and acts: Secondary | ICD-10-CM

## 2020-12-07 DIAGNOSIS — F320 Major depressive disorder, single episode, mild: Secondary | ICD-10-CM

## 2020-12-07 MED ORDER — SERTRALINE HCL 50 MG PO TABS
ORAL_TABLET | ORAL | 0 refills | Status: DC
  Filled 2020-12-07: qty 30, 30d supply, fill #0

## 2020-12-07 MED ORDER — VYVANSE 30 MG PO CAPS
30.0000 mg | ORAL_CAPSULE | Freq: Every day | ORAL | 0 refills | Status: DC
  Filled 2020-12-07: qty 30, 30d supply, fill #0

## 2020-12-07 NOTE — Patient Instructions (Signed)
1. Start sertraline 25 mg at night for 2 weeks, then 50 mg at night 2. Referral to therapy  3. Consider annual primary care visit to check labs (TSH, Hb) to rule out medical condition associated with mood symptoms 4. Next appointment- 5/18 at 2 PM

## 2020-12-07 NOTE — Addendum Note (Signed)
Addended by: Neysa Hotter on: 12/07/2020 02:46 PM   Modules accepted: Orders

## 2020-12-08 ENCOUNTER — Telehealth: Payer: Self-pay

## 2020-12-08 NOTE — Telephone Encounter (Signed)
she has questions about the zoloft and vyvanse . Pt states she is concerned about taking both medications, and she concerned about the sleepiness it can cause.

## 2020-12-08 NOTE — Telephone Encounter (Signed)
Has she tried Zoloft yet? If not, please advise her that Zoloft tends to cause some drowsiness/sleepiness as a side effect, so I would usually recommend people to take at night. However, if she thinks it makes her awake, try to take it in the morning. Advise her to contact us again if it causes issues with sleep.

## 2020-12-14 ENCOUNTER — Telehealth: Payer: Self-pay

## 2020-12-14 NOTE — Telephone Encounter (Signed)
Discussed with the patient. She was concerned of risk of seizure from combination of sertraline and vyvanse. She has no history of seizure, although her cousin has a history of seizure. Discussed potential risk of seizure due to drug interaction, although this combination of meds have been safely used on most occasions. Discussed an option of starting lexapro instead of sertraline to avoid this medication interaction. Also discussed risk of serotonin syndrome from both sertraline/lexapro with Vyvanse. She will contact the office if she wants to pursue lexapro after discussing with her mother.

## 2020-12-14 NOTE — Telephone Encounter (Signed)
pt states that she read that when you take both the vyvanse and the zoloft you could have a seziure. She states she wanted to speak with the doctor in regards to this and that she had another concern she wanted to discuss.

## 2020-12-15 DIAGNOSIS — Z79899 Other long term (current) drug therapy: Secondary | ICD-10-CM | POA: Diagnosis not present

## 2020-12-15 DIAGNOSIS — F902 Attention-deficit hyperactivity disorder, combined type: Secondary | ICD-10-CM | POA: Diagnosis not present

## 2020-12-15 DIAGNOSIS — F419 Anxiety disorder, unspecified: Secondary | ICD-10-CM | POA: Diagnosis not present

## 2020-12-16 ENCOUNTER — Other Ambulatory Visit (HOSPITAL_COMMUNITY): Payer: Self-pay

## 2020-12-19 ENCOUNTER — Telehealth: Payer: Self-pay

## 2020-12-19 ENCOUNTER — Other Ambulatory Visit (HOSPITAL_COMMUNITY): Payer: Self-pay

## 2020-12-19 ENCOUNTER — Other Ambulatory Visit: Payer: Self-pay | Admitting: Psychiatry

## 2020-12-19 MED ORDER — ESCITALOPRAM OXALATE 10 MG PO TABS
ORAL_TABLET | ORAL | 0 refills | Status: DC
  Filled 2020-12-19: qty 30, 30d supply, fill #0

## 2020-12-19 NOTE — Telephone Encounter (Signed)
Pt was contacted and given the instruction for the new medication.  Pharmacy was also contact and left a voicemail message to cancel the sertraline order per dr. Vanetta Shawl order.

## 2020-12-19 NOTE — Telephone Encounter (Signed)
pt called left a message that she wanted to try the lexapro instead of the zoloft.

## 2020-12-19 NOTE — Telephone Encounter (Signed)
-   Could you contact the patient and advise the following: start lexapro 5 mg daily for two weeks, then 10 mg daily - Please contact [facility] pharmacy, and cancel sertraline order.

## 2020-12-20 ENCOUNTER — Other Ambulatory Visit (HOSPITAL_COMMUNITY): Payer: Self-pay

## 2020-12-23 ENCOUNTER — Other Ambulatory Visit (HOSPITAL_COMMUNITY): Payer: Self-pay

## 2020-12-26 ENCOUNTER — Other Ambulatory Visit (HOSPITAL_COMMUNITY): Payer: Self-pay

## 2020-12-26 MED ORDER — VYVANSE 20 MG PO CAPS
ORAL_CAPSULE | ORAL | 0 refills | Status: DC
  Filled 2020-12-26: qty 30, 30d supply, fill #0

## 2020-12-27 ENCOUNTER — Other Ambulatory Visit (HOSPITAL_COMMUNITY): Payer: Self-pay

## 2020-12-28 NOTE — Progress Notes (Signed)
Virtual Visit via Video Note  I connected with Sandra Dawson on 01/04/21 at  2:00 PM EDT by a video enabled telemedicine application and verified that I am speaking with the correct person using two identifiers.  Location: Patient: home Provider: office Persons participated in the visit- patient, provider   I discussed the limitations of evaluation and management by telemedicine and the availability of in person appointments. The patient expressed understanding and agreed to proceed.   I discussed the assessment and treatment plan with the patient. The patient was provided an opportunity to ask questions and all were answered. The patient agreed with the plan and demonstrated an understanding of the instructions.   The patient was advised to call back or seek an in-person evaluation if the symptoms worsen or if the condition fails to improve as anticipated.  I provided 22 minutes of non-face-to-face time during this encounter.   Sandra Hottereina Latron Ribas, MD    [facility] Progress Note  01/04/2021 2:37 PM Sandra Dawson  MRN:  [id number]  Chief Complaint:  Chief Complaint    Follow-up; Depression; Anxiety     HPI:  This is a follow-up appointment for OCD and depression.  She states that she has been feeling better.  She is taking Lexapro 5 mg daily.  It has been taking less for her to take a shower.  She has also started to listen to the pod cast during shower so that she will not get stressed.  There has been a few times for her washing hands around 20 minutes.  She stopped wiping from the grocery store.  She is procrastinating wiping chair, stating that the board is pooping everywhere.  She states that the work is going very well.  However, she is concerned of moving to [place] due to work.  She is considering of talking with her supervisor whether it can be postponed.  She had a birthday a week ago.  She said late that with her boyfriend, her parents and her boyfriend's parents.   It was a busy week, although she had fun.  She has started to get back on crafting on Easter and Mother's Day.  She feels good about these changes.  She is hoping to go back to the gym again; she used to go there regularly.  She sleeps better.  She feels less depressed.  She has fair concentration; she is taking lower dose of Vyvanse.  She had a few pounds of weight gain, which she attributes it more to eating cake due to her birthday.  She denies SI.   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 Mixed obsessional thoughts and acts  F42.2   2. Current mild episode of major depressive disorder without prior episode (HCC)  F32.0     Past Psychiatric History: Please see initial evaluation for full details. I have reviewed the history. No updates at this time.     Past Medical History:  Past Medical History:  Diagnosis Date  . Acid reflux   . ADHD (attention deficit hyperactivity disorder)   . History of chicken pox     Past Surgical History:  Procedure Laterality Date  .  MYRINGOTOMY WITH TUBE PLACEMENT    . NO PAST SURGERIES      Family Psychiatric History: Please see initial evaluation for full details. I have reviewed the history. No updates at this time.     Family History:  Family History  Problem Relation Age of Onset  . Hyperlipidemia Father   . Diabetes Father   . ADD / ADHD Sister   . Depression Sister   . Eating disorder Sister   . Pulmonary fibrosis Maternal Grandmother   . Lung cancer Maternal Grandfather   . Dementia Maternal Grandfather   . Bladder Cancer Paternal Grandmother   . Heart disease Paternal Grandfather   . Heart  attack Paternal Grandfather   . Healthy Mother     Social History:  Social History   Socioeconomic History  . Marital status: Single    Spouse name: Not on file  . Number of children: 0  . Years of education: Not on file  . Highest education level: Not on file  Occupational History  . Occupation: Management consultant  Tobacco Use  . Smoking status: Never Smoker  . Smokeless tobacco: Never Used  Vaping Use  . Vaping Use: Never used  Substance and Sexual Activity  . Alcohol use: Yes    Alcohol/week: 3.0 standard drinks    Types: 3 Cans of beer per week  . Drug use: Never  . Sexual activity: Yes    Birth control/protection: Pill  Other Topics Concern  . Not on file  Social History Narrative   In college - Production designer, theatre/television/film regularly   Social Determinants of Health   Financial Resource Strain: Not on file  Food Insecurity: Not on file  Transportation Needs: Not on file  Physical Activity: Not on file  Stress: Not on file  Social Connections: Not on file    Allergies: No Known Allergies  Metabolic Disorder Labs: No results found for: HGBA1C, MPG No results found for: PROLACTIN Lab Results  Component Value Date   CHOL 169 01/26/2014   TRIG 73.0 01/26/2014   HDL 62.00 01/26/2014   CHOLHDL 3 01/26/2014   VLDL 14.6 01/26/2014   LDLCALC 92 01/26/2014   Lab Results  Component Value Date   TSH 2.02 01/26/2014    Therapeutic Level Labs: No results found for: LITHIUM No results found for: VALPROATE No components found for:  CBMZ  Current Medications: Current Outpatient Medications  Medication Sig Dispense Refill  . escitalopram (LEXAPRO) 10 MG tablet take 1/2 tablet by mouth once daily for 2 weeks then take 1 tablet daily 30 tablet 0  . levonorgestrel-ethinyl estradiol (ALESSE) 0.1-20 MG-MCG tablet TAKE 1 TABLET BY MOUTH ONCE DAILY 84 tablet 4  . levonorgestrel-ethinyl estradiol (ALESSE) 0.1-20 MG-MCG tablet TAKE 1 TABLET BY MOUTH  ONCE A DAY 84 tablet 0  . levonorgestrel-ethinyl estradiol (AVIANE,ALESSE,LESSINA) 0.1-20 MG-MCG tablet Take 1 tablet by mouth daily. 90 tablet 1  . lisdexamfetamine (VYVANSE) 20 MG capsule TAKE 1 CAPSULE BY MOUTH DAILY 30 capsule 0  . lisdexamfetamine (VYVANSE) 20 MG capsule TAKE 1 CAPSULE BY MOUTH ONCE A DAY 30 capsule 0  . lisdexamfetamine (VYVANSE) 30 MG capsule Take 1 capsule (30 mg total) by mouth daily. 30 capsule 0  . lisdexamfetamine (VYVANSE) 30 MG capsule TAKE 1 CAPSULE BY MOUTH ONCE A DAY 30 capsule 0  . lisdexamfetamine (VYVANSE) 30 MG capsule TAKE 1 CAPSULE BY MOUTH ONCE A DAY 30 capsule 0  . lisdexamfetamine (VYVANSE) 30 MG capsule TAKE  1 CAPSULE BY MOUTH ONCE A DAY 30 capsule 0  . Multiple Vitamin (MULTIVITAMIN) tablet Take 1 tablet by mouth daily.    . pantoprazole (PROTONIX) 40 MG tablet Take 1 tablet (40 mg total) by mouth daily. 30 tablet 11  . pantoprazole (PROTONIX) 40 MG tablet TAKE 1 TABLET (40 MG TOTAL) BY MOUTH DAILY. 30 tablet 11  . pantoprazole (PROTONIX) 40 MG tablet TAKE 1 TABLET (40 MG TOTAL) BY MOUTH DAILY. 30 tablet 1  . VYVANSE 20 MG capsule Take 20 mg by mouth daily.     No current facility-administered medications for this visit.     Musculoskeletal: Strength & Muscle Tone: N/A Gait & Station: N/A Patient leans: N/A  Psychiatric Specialty Exam: Review of Systems  Psychiatric/Behavioral: Negative for agitation, behavioral problems, confusion, decreased concentration, dysphoric mood, hallucinations, self-injury, sleep disturbance and suicidal ideas. The patient is nervous/anxious. The patient is not hyperactive.   All other systems reviewed and are negative.   There were no vitals taken for this visit.There is no height or weight on file to calculate BMI.  General Appearance: Fairly Groomed  Eye Contact:  Good  Speech:  Clear and Coherent  Volume:  Normal  Mood:  better  Affect:  Appropriate, Congruent and brighter  Thought Process:  Coherent   Orientation:  Full (Time, Place, and Person)  Thought Content: Logical   Suicidal Thoughts:  No  Homicidal Thoughts:  No  Memory:  Immediate;   Good  Judgement:  Good  Insight:  Good  Psychomotor Activity:  Normal  Concentration:  Concentration: Good and Attention Span: Good  Recall:  Good  Fund of Knowledge: Good  Language: Good  Akathisia:  No  Handed:  Right  AIMS (if indicated): not done  Assets:  Communication Skills Desire for Improvement  ADL's:  Intact  Cognition: WNL  Sleep:  Fair   Screenings: PHQ2-9   Flowsheet Row [facility] from [facility]/07/2020 in [facility] Nutrition [facility]  PHQ-2 Total Score 3 0  PHQ-9 Total Score 9 --    Flowsheet Row Video Visit from 01/04/2021 in [facility]  C-SSRS RISK CATEGORY No Risk       Assessment and Plan:  Sandra Dawson is a 25 y.o. year old female with a history of OCD, anxiety, history of ADHD, who presents for follow up appointment for below.   1. Mixed obsessional thoughts and acts 2. Current mild episode of major depressive disorder without prior episode (HCC) There has been overall improvement in OCD of washing, and depressive symptoms since starting Lexapro.  Psychosocial stressors includes conflict with her boyfriend, and social distance due to pandemic.   Will do further up titration of Lexapro to optimize treatment for OCD, anxiety and depression.  She is advised again to contact the clinic for CBT.   # history of ADHD She sees [place] and attentional specialist.  Vyvanse was tapered down, she reports good benefit from this medication.   Plan 1. Increase lexapro 10 mg daily- she will contact the clinic to notify the pharmacy she will use 2. Referred to therapy  3. Consider seeing PCP to check labs (TSH, Hb) to rule out medical condition associated with mood symptoms  4. Next appointment- 6/30 at 2 PM for  30 mins, video - on Vyvanse 20 mg daily    The patient demonstrates the following risk factors for suicide: Chronic risk factors for suicide include: psychiatric disorder of anxiety.  Acute risk factors for suicide include: N/A. Protective factors for this patient include: positive social support, responsibility to others (children, family), coping skills and hope for the future. Considering these factors, the overall suicide risk at this point appears to be low. Patient is appropriate for outpatient follow up.   Sandra Hotter, MD 01/04/2021, 2:37 PM

## 2021-01-04 ENCOUNTER — Other Ambulatory Visit: Payer: Self-pay

## 2021-01-04 ENCOUNTER — Telehealth (INDEPENDENT_AMBULATORY_CARE_PROVIDER_SITE_OTHER): Payer: Self-pay | Admitting: Psychiatry

## 2021-01-04 ENCOUNTER — Encounter: Payer: Self-pay | Admitting: Psychiatry

## 2021-01-04 DIAGNOSIS — F422 Mixed obsessional thoughts and acts: Secondary | ICD-10-CM

## 2021-01-04 DIAGNOSIS — F320 Major depressive disorder, single episode, mild: Secondary | ICD-10-CM

## 2021-01-04 NOTE — Patient Instructions (Signed)
1. Increase lexapro 10 mg daily 2. Referred to therapy  3. Consider seeing PCP to check labs (TSH, Hb) to rule out medical condition associated with mood symptoms  4. Next appointment- 6/30 at 2 PM

## 2021-01-12 ENCOUNTER — Telehealth: Payer: 59 | Admitting: Physician Assistant

## 2021-01-12 DIAGNOSIS — H9209 Otalgia, unspecified ear: Secondary | ICD-10-CM | POA: Diagnosis not present

## 2021-01-13 NOTE — Progress Notes (Signed)
Based on what you shared in your evisit questionnaire and responses to the provider earlier today, it seems that you have a mild inflammation of the outer ear that seems to be improving/resolving on its own. Giving lack of true fever (100+), ear swelling, drainage from the ear, it is unlikely you have a true external ear infection (swimmer's ear) or middle ear infection. I would continue observing the area and avoid putting any OTC ear drops in the canal. You can use your Flonase for history of eustachian tube dysfunction. This is ok to use with current medication. If symptoms are not improving then I do recommend in-person evaluation so we can make sure there you get the proper diagnosis and treatment.

## 2021-01-13 NOTE — Progress Notes (Signed)
I have spent 5 minutes in review of e-visit questionnaire, review and updating patient chart, medical decision making and response to patient.   Abram Sax Cody Dayanna Pryce, PA-C    

## 2021-01-23 ENCOUNTER — Other Ambulatory Visit (HOSPITAL_COMMUNITY): Payer: Self-pay

## 2021-01-23 MED ORDER — VYVANSE 20 MG PO CAPS
ORAL_CAPSULE | ORAL | 0 refills | Status: DC
  Filled 2021-01-23: qty 30, 30d supply, fill #0

## 2021-01-23 MED ORDER — VYVANSE 20 MG PO CAPS: ORAL_CAPSULE | ORAL | 0 refills | Status: DC

## 2021-01-26 ENCOUNTER — Other Ambulatory Visit: Payer: Self-pay

## 2021-01-26 MED ORDER — ESCITALOPRAM OXALATE 10 MG PO TABS: 10.0000 mg | ORAL_TABLET | Freq: Every day | ORAL | 0 refills | Status: DC

## 2021-01-26 NOTE — Telephone Encounter (Signed)
pt called states she needs a refill on the lexapro.

## 2021-01-30 ENCOUNTER — Other Ambulatory Visit: Payer: Self-pay

## 2021-01-30 ENCOUNTER — Other Ambulatory Visit (HOSPITAL_COMMUNITY): Payer: Self-pay

## 2021-01-30 MED ORDER — PANTOPRAZOLE SODIUM 40 MG PO TBEC: 40.0000 mg | DELAYED_RELEASE_TABLET | Freq: Every day | ORAL | 11 refills | Status: DC

## 2021-01-31 ENCOUNTER — Other Ambulatory Visit (HOSPITAL_COMMUNITY): Payer: Self-pay

## 2021-02-10 ENCOUNTER — Other Ambulatory Visit: Payer: Self-pay | Admitting: Psychiatry

## 2021-02-10 NOTE — Telephone Encounter (Signed)
Dr.Hisada's patient °

## 2021-02-13 NOTE — Progress Notes (Signed)
BH MD/PA/NP OP Progress Note  02/16/2021 2:29 PM Sandra Dawson  MRN:  [id number]  Chief Complaint:  Chief Complaint   Depression; Follow-up; Anxiety    HPI:  This is a follow-up appointment for OCD and depression.  She states that it is now taking 45 to 60 minutes to take a shower as opposed to 2 hours in the past.  She feels that it has been less taunting.  She also has been trying to constantly take a shower now that it is less stressful.  She feels that she is more in control with things.  She is trying to work on cleaning.  She feels stressed as she is concerned about organization, although she does not have any concern about sanitization.  She has many crafts, mails to work on.  She is planning to move to [place] by Labor Day for her work.  Although she feels nervous about it, she denies significant concern.  She has initial insomnia, due to racing thoughts.  She denies change in weight or appetite.  She denies SI.  Although she has difficulty in concentration especially after lowering the dose of Vyvanse, she prefers this way as opposed to worsening in anxiety.  She denies any side effects since up titration of Lexapro.  She wants to stay on the same dose of lexapro at this time.    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 Mixed obsessional thoughts and acts  F42.2     2. Current mild episode of major depressive disorder without prior episode (HCC)  F32.0       Past Psychiatric History: Please see initial evaluation for full details. I have reviewed the history. No updates at this time.     Past Medical History:  Past Medical History:   Diagnosis Date   Acid reflux    ADHD (attention deficit hyperactivity disorder)    History of chicken pox     Past Surgical History:  Procedure Laterality Date   MYRINGOTOMY WITH TUBE PLACEMENT     NO PAST SURGERIES      Family Psychiatric History: Please see initial evaluation for full details. I have reviewed the history. No updates at this time.     Family History:  Family History  Problem Relation Age of Onset   Hyperlipidemia Father    Diabetes Father    ADD / ADHD Sister    Depression Sister    Eating disorder Sister    Pulmonary fibrosis Maternal Grandmother    Lung cancer Maternal Grandfather    Dementia Maternal Grandfather    Bladder Cancer Paternal Grandmother    Heart disease Paternal Grandfather    Heart attack Paternal Grandfather    Healthy Mother     Social History:  Social History   Socioeconomic History   Marital status: Single    Spouse name: Not on file   Number of children: 0   Years of education: Not on file   Highest education level: Not on file  Occupational History   Occupation: Management consultant  Tobacco Use   Smoking status: Never   Smokeless tobacco: Never  Vaping Use   Vaping Use: Never used  Substance and Sexual Activity   Alcohol use: Yes    Alcohol/week: 3.0 standard drinks    Types: 3 Cans of beer per week   Drug use: Never   Sexual activity: Yes    Birth control/protection: Pill  Other Topics Concern   Not on file  Social History Narrative   In college - Production designer, theatre/television/film regularly   Social Determinants of Health   Financial Resource Strain: Not on file  Food Insecurity: Not on file  Transportation Needs: Not on file  Physical Activity: Not on file  Stress: Not on file  Social Connections: Not on file    Allergies: No Known Allergies  Metabolic Disorder Labs: No results found for: HGBA1C, MPG No results found for: PROLACTIN Lab Results  Component Value Date   CHOL 169  01/26/2014   TRIG 73.0 01/26/2014   HDL 62.00 01/26/2014   CHOLHDL 3 01/26/2014   VLDL 14.6 01/26/2014   LDLCALC 92 01/26/2014   Lab Results  Component Value Date   TSH 2.02 01/26/2014    Therapeutic Level Labs: No results found for: LITHIUM No results found for: VALPROATE No components found for:  CBMZ  Current Medications: Current Outpatient Medications  Medication Sig Dispense Refill   [START ON 02/25/2021] escitalopram (LEXAPRO) 10 MG tablet Take 1 tablet (10 mg total) by mouth daily. 90 tablet 0   levonorgestrel-ethinyl estradiol (ALESSE) 0.1-20 MG-MCG tablet TAKE 1 TABLET BY MOUTH ONCE DAILY 84 tablet 4   lisdexamfetamine (VYVANSE) 30 MG capsule Take 1 capsule (30 mg total) by mouth daily. 30 capsule 0   Multiple Vitamin (MULTIVITAMIN) tablet Take 1 tablet by mouth daily.     pantoprazole (PROTONIX) 40 MG tablet Take 1 tablet (40 mg total) by mouth daily. 30 tablet 11   No current facility-administered medications for this visit.     Musculoskeletal: Strength & Muscle Tone:  N/A Gait & Station:  N/A Patient leans: N/A  Psychiatric Specialty Exam: Review of Systems  Psychiatric/Behavioral:  Positive for sleep disturbance. Negative for agitation, behavioral problems, confusion, decreased concentration, dysphoric mood, hallucinations, self-injury and suicidal ideas. The patient is nervous/anxious. The patient is not hyperactive.   All other systems reviewed and are negative.  There were no vitals taken for this visit.There is no height or weight on file to calculate BMI.  General Appearance: Fairly Groomed  Eye Contact:  Good  Speech:  Clear and Coherent  Volume:  Normal  Mood:   better  Affect:  Appropriate, Congruent, and calm, smiles  Thought Process:  Coherent  Orientation:  Full (Time, Place, and Person)  Thought Content: Logical   Suicidal Thoughts:  No  Homicidal Thoughts:  No  Memory:  Immediate;   Good  Judgement:  Good  Insight:  Good  Psychomotor  Activity:  Normal  Concentration:  Concentration: Good and Attention Span: Good  Recall:  Good  Fund of Knowledge: Good  Language: Good  Akathisia:  No  Handed:  Right  AIMS (if indicated): not done  Assets:  Communication Skills Desire for Improvement  ADL's:  Intact  Cognition: WNL  Sleep:  Fair   Screenings: PHQ2-9    Flowsheet Row [facility] Visit from 12/07/2020 in [facility] Nutrition [facility] 04/01/2014 in [facility]  PHQ-2 Total Score 3 0  PHQ-9 Total Score 9 --      Flowsheet Row Video Visit from 01/04/2021 in [facility]  C-SSRS RISK CATEGORY No  Risk        Assessment and Plan:  XAN SPARKMAN is a 25 y.o. year old female with a history of  OCD, anxiety, history of ADHD, who presents for follow up appointment for below.    1. Mixed obsessional thoughts and acts 2. Current mild episode of major depressive disorder without prior episode (HCC) There has been overall improvement in OCD of washing, and depressive symptoms since up titration of Lexapro. Psychosocial stressors includes conflict with her boyfriend, and social distance due to pandemic.   Although further up titration will likely be beneficial, she prefers to stay on the current dose at this time.  Will continue Lexapro for OCD, anxiety and depression.  She is planning to fill out paperwork to see a therapist.    # history of ADHD She sees [place] and attentional specialist.  Vyvanse was tapered down, she reports good benefit from this medication.    Plan 1. Continue lexapro 10 mg daily 2. Consider seeing PCP to check labs (TSH, Hb) to rule out medical condition associated with mood symptoms 3. Next appointment- 8/23 at 11:30 , video - on Vyvanse 20 mg daily      The patient demonstrates the following risk factors for suicide: Chronic risk factors for suicide include: psychiatric disorder of anxiety. Acute risk factors  for suicide include: N/A. Protective factors for this patient include: positive social support, responsibility to others (children, family), coping skills and hope for the future. Considering these factors, the overall suicide risk at this point appears to be low. Patient is appropriate for outpatient follow up.        Neysa Hotter, MD 02/16/2021, 2:29 PM

## 2021-02-16 ENCOUNTER — Other Ambulatory Visit: Payer: Self-pay

## 2021-02-16 ENCOUNTER — Telehealth (INDEPENDENT_AMBULATORY_CARE_PROVIDER_SITE_OTHER): Payer: Self-pay | Admitting: Psychiatry

## 2021-02-16 ENCOUNTER — Other Ambulatory Visit (HOSPITAL_COMMUNITY): Payer: Self-pay

## 2021-02-16 ENCOUNTER — Encounter: Payer: Self-pay | Admitting: Psychiatry

## 2021-02-16 DIAGNOSIS — F320 Major depressive disorder, single episode, mild: Secondary | ICD-10-CM

## 2021-02-16 DIAGNOSIS — F422 Mixed obsessional thoughts and acts: Secondary | ICD-10-CM

## 2021-02-16 MED ORDER — ESCITALOPRAM OXALATE 10 MG PO TABS: 10.0000 mg | ORAL_TABLET | Freq: Every day | ORAL | 0 refills | Status: DC

## 2021-02-16 NOTE — Patient Instructions (Signed)
1. Continue lexapro 10 mg daily 2. Consider seeing PCP to check labs (TSH, Hb) to rule out medical condition associated with mood symptoms 3. Next appointment- 8/23 at 11:30

## 2021-02-26 ENCOUNTER — Other Ambulatory Visit: Payer: Self-pay | Admitting: Gastroenterology

## 2021-03-10 ENCOUNTER — Encounter: Payer: Self-pay | Admitting: Physician Assistant

## 2021-03-10 ENCOUNTER — Telehealth: Payer: Self-pay | Admitting: Physician Assistant

## 2021-03-10 DIAGNOSIS — B9689 Other specified bacterial agents as the cause of diseases classified elsewhere: Secondary | ICD-10-CM

## 2021-03-10 DIAGNOSIS — J329 Chronic sinusitis, unspecified: Secondary | ICD-10-CM

## 2021-03-10 MED ORDER — AMOXICILLIN-POT CLAVULANATE 875-125 MG PO TABS: 1.0000 | ORAL_TABLET | Freq: Two times a day (BID) | ORAL | 0 refills | Status: DC

## 2021-03-10 MED ORDER — FLUTICASONE PROPIONATE 50 MCG/ACT NA SUSP
2.0000 | Freq: Every day | NASAL | 6 refills | Status: DC
Start: 2021-03-10 — End: 2021-07-18

## 2021-03-10 NOTE — Patient Instructions (Signed)
Sandra Dawson, thank you for joining Margaretann Loveless, PA-C for today's virtual visit.  While this provider is not your primary care provider (PCP), if your PCP is located in our provider database this encounter information will be shared with them immediately following your visit.  Consent: (Patient) Sandra Dawson provided verbal consent for this virtual visit at the beginning of the encounter.  Current Medications:  Current Outpatient Medications:    amoxicillin-clavulanate (AUGMENTIN) 875-125 MG tablet, Take 1 tablet by mouth 2 (two) times daily., Disp: 20 tablet, Rfl: 0   fluticasone (FLONASE) 50 MCG/ACT nasal spray, Place 2 sprays into both nostrils daily., Disp: 16 g, Rfl: 6   escitalopram (LEXAPRO) 10 MG tablet, Take 1 tablet (10 mg total) by mouth daily., Disp: 90 tablet, Rfl: 0   levonorgestrel-ethinyl estradiol (ALESSE) 0.1-20 MG-MCG tablet, TAKE 1 TABLET BY MOUTH ONCE DAILY, Disp: 84 tablet, Rfl: 4   lisdexamfetamine (VYVANSE) 30 MG capsule, Take 1 capsule (30 mg total) by mouth daily., Disp: 30 capsule, Rfl: 0   Multiple Vitamin (MULTIVITAMIN) tablet, Take 1 tablet by mouth daily., Disp: , Rfl:    pantoprazole (PROTONIX) 40 MG tablet, Take 1 tablet (40 mg total) by mouth daily., Disp: 30 tablet, Rfl: 11   Medications ordered in this encounter:  Meds ordered this encounter  Medications   amoxicillin-clavulanate (AUGMENTIN) 875-125 MG tablet    Sig: Take 1 tablet by mouth 2 (two) times daily.    Dispense:  20 tablet    Refill:  0    Order Specific Question:   Supervising Provider    Answer:   MILLER, BRIAN [[id number]]   fluticasone (FLONASE) 50 MCG/ACT nasal spray    Sig: Place 2 sprays into both nostrils daily.    Dispense:  16 g    Refill:  6    Order Specific Question:   Supervising Provider    Answer:   Hyacinth Meeker, BRIAN [[id number]]     *If you need refills on other medications prior to your next appointment, please contact your pharmacy*  Follow-Up: Call back or seek  an in-person evaluation if the symptoms worsen or if the condition fails to improve as anticipated.  Other Instructions See below  If you have been instructed to have an in-person evaluation today at a local Urgent Care facility, please use the link below. It will take you to a list of all of our available [facility], including address, phone number and hours of operation. Please do not delay care.  [facility]  If you or a family member do not have a primary care provider, use the link below to schedule a visit and establish care. When you choose a [facility] primary care physician or advanced practice provider, you gain a long-term partner in health. Find a Primary Care Provider  Learn more about [facility] in-office and virtual care options: [place] - Get Care Now  Sinusitis, Adult Sinusitis is soreness and swelling (inflammation) of your sinuses. Sinuses are hollow spaces in the bones around your face. They are located: Around your eyes. In the middle of your forehead. Behind your nose. In your cheekbones. Your sinuses and nasal passages are lined with a fluid called mucus. Mucus drains out of your sinuses. Swelling can trap mucus in your sinuses. This lets germs (bacteria, virus, or fungus) grow, which leads to infection. Most of the time, this condition is caused bya virus. What are the causes? This condition is caused by: Allergies.  Asthma. Germs. Things that block your nose or sinuses. Growths in the nose (nasal polyps). Chemicals or irritants in the air. Fungus (rare). What increases the risk? You are more likely to develop this condition if: You have a weak body defense system (immune system). You do a lot of swimming or diving. You use nasal sprays too much. You smoke. What are the signs or symptoms? The main symptoms of this condition are pain and a feeling of pressure around the sinuses. Other symptoms include: Stuffy nose  (congestion). Runny nose (drainage). Swelling and warmth in the sinuses. Headache. Toothache. A cough that may get worse at night. Mucus that collects in the throat or the back of the nose (postnasal drip). Being unable to smell and taste. Being very tired (fatigue). A fever. Sore throat. Bad breath. How is this diagnosed? This condition is diagnosed based on: Your symptoms. Your medical history. A physical exam. Tests to find out if your condition is short-term (acute) or long-term (chronic). Your doctor may: Check your nose for growths (polyps). Check your sinuses using a tool that has a light (endoscope). Check for allergies or germs. Do imaging tests, such as an MRI or CT scan. How is this treated? Treatment for this condition depends on the cause and whether it is short-term or long-term. If caused by a virus, your symptoms should go away on their own within 10 days. You may be given medicines to relieve symptoms. They include: Medicines that shrink swollen tissue in the nose. Medicines that treat allergies (antihistamines). A spray that treats swelling of the nostrils.  Rinses that help get rid of thick mucus in your nose (nasal saline washes). If caused by bacteria, your doctor may wait to see if you will get better without treatment. You may be given antibiotic medicine if you have: A very bad infection. A weak body defense system. If caused by growths in the nose, you may need to have surgery. Follow these instructions at home: Medicines Take, use, or apply over-the-counter and prescription medicines only as told by your doctor. These may include nasal sprays. If you were prescribed an antibiotic medicine, take it as told by your doctor. Do not stop taking the antibiotic even if you start to feel better. Hydrate and humidify  Drink enough water to keep your pee (urine) pale yellow. Use a cool mist humidifier to keep the humidity level in your home above 50%. Breathe  in steam for 10-15 minutes, 3-4 times a day, or as told by your doctor. You can do this in the bathroom while a hot shower is running. Try not to spend time in cool or dry air.  Rest Rest as much as you can. Sleep with your head raised (elevated). Make sure you get enough sleep each night. General instructions  Put a warm, moist washcloth on your face 3-4 times a day, or as often as told by your doctor. This will help with discomfort. Wash your hands often with soap and water. If there is no soap and water, use hand sanitizer. Do not smoke. Avoid being around people who are smoking (secondhand smoke). Keep all follow-up visits as told by your doctor. This is important.  Contact a doctor if: You have a fever. Your symptoms get worse. Your symptoms do not get better within 10 days. Get help right away if: You have a very bad headache. You cannot stop throwing up (vomiting). You have very bad pain or swelling around your face or eyes. You have  trouble seeing. You feel confused. Your neck is stiff. You have trouble breathing. Summary Sinusitis is swelling of your sinuses. Sinuses are hollow spaces in the bones around your face. This condition is caused by tissues in your nose that become inflamed or swollen. This traps germs. These can lead to infection. If you were prescribed an antibiotic medicine, take it as told by your doctor. Do not stop taking it even if you start to feel better. Keep all follow-up visits as told by your doctor. This is important. This information is not intended to replace advice given to you by your health care provider. Make sure you discuss any questions you have with your healthcare provider. Document Revised: 01/06/2018 Document Reviewed: 01/06/2018 Elsevier Patient Education  2022 [employer].

## 2021-03-10 NOTE — Progress Notes (Signed)
Virtual Visit Consent   Sandra Dawson, you are scheduled for a virtual visit with a [facility] provider today.     Just as with appointments in the office, your consent must be obtained to participate.  Your consent will be active for this visit and any virtual visit you may have with one of our providers in the next 365 days.     If you have a MyChart account, a copy of this consent can be sent to you electronically.  All virtual visits are billed to your insurance company just like a traditional visit in the office.    As this is a virtual visit, video technology does not allow for your provider to perform a traditional examination.  This may limit your provider's ability to fully assess your condition.  If your provider identifies any concerns that need to be evaluated in person or the need to arrange testing (such as labs, EKG, etc.), we will make arrangements to do so.     Although advances in technology are sophisticated, we cannot ensure that it will always work on either your end or our end.  If the connection with a video visit is poor, the visit may have to be switched to a telephone visit.  With either a video or telephone visit, we are not always able to ensure that we have a secure connection.     I need to obtain your verbal consent now.   Are you willing to proceed with your visit today?    Sandra Dawson has provided verbal consent on 03/10/2021 for a virtual visit (video or telephone).   Margaretann Loveless, PA-C   Date: 03/10/2021 11:25 AM   Virtual Visit via Video Note   I, Margaretann Loveless, connected with  Sandra Dawson  ([id number], 06-30-1996) on 03/10/21 at 11:30 AM EDT by a video-enabled telemedicine application and verified that I am speaking with the correct person using two identifiers.  Location: Patient: Virtual Visit Location Patient: Home Provider: Virtual Visit Location Provider: Home Office   I discussed the limitations of evaluation and  management by telemedicine and the availability of in person appointments. The patient expressed understanding and agreed to proceed.    History of Present Illness: Sandra Dawson is a 25 y.o. who identifies as a female who was assigned female at birth, and is being seen today for head congestion, URI symptoms.  HPI: URI  This is a new problem. The current episode started in the past 7 days (Monday). The problem has been gradually worsening. There has been no fever (99.7). Associated symptoms include congestion, coughing (dry), headaches, rhinorrhea, sinus pain and a sore throat. Associated symptoms comments: Post nasal drainage. She has tried antihistamine, increased fluids and sleep (zyrtec) for the symptoms. The treatment provided no relief.    Covid tested on 03/07/21, negative.  Problems:  Patient Active Problem List   Diagnosis Date Noted   Chronic cough 11/26/2017   Neck pain, musculoskeletal 02/18/2017   Obese    ADD (attention deficit disorder)     Allergies: No Known Allergies Medications:  Current Outpatient Medications:    amoxicillin-clavulanate (AUGMENTIN) 875-125 MG tablet, Take 1 tablet by mouth 2 (two) times daily., Disp: 20 tablet, Rfl: 0   fluticasone (FLONASE) 50 MCG/ACT nasal spray, Place 2 sprays into both nostrils daily., Disp: 16 g, Rfl: 6   escitalopram (LEXAPRO) 10 MG tablet, Take 1 tablet (10 mg total) by mouth daily., Disp: 90 tablet, Rfl: 0  levonorgestrel-ethinyl estradiol (ALESSE) 0.1-20 MG-MCG tablet, TAKE 1 TABLET BY MOUTH ONCE DAILY, Disp: 84 tablet, Rfl: 4   lisdexamfetamine (VYVANSE) 30 MG capsule, Take 1 capsule (30 mg total) by mouth daily., Disp: 30 capsule, Rfl: 0   Multiple Vitamin (MULTIVITAMIN) tablet, Take 1 tablet by mouth daily., Disp: , Rfl:    pantoprazole (PROTONIX) 40 MG tablet, Take 1 tablet (40 mg total) by mouth daily., Disp: 30 tablet, Rfl: 11  Observations/Objective: Patient is well-developed, well-nourished in no acute distress.   Resting comfortably at home.  Head is normocephalic, atraumatic.  No labored breathing.  Speech is clear and coherent with logical content.  Patient is alert and oriented at baseline.    Assessment and Plan: 1. Bacterial sinusitis - amoxicillin-clavulanate (AUGMENTIN) 875-125 MG tablet; Take 1 tablet by mouth 2 (two) times daily.  Dispense: 20 tablet; Refill: 0 - fluticasone (FLONASE) 50 MCG/ACT nasal spray; Place 2 sprays into both nostrils daily.  Dispense: 16 g; Refill: 6 Worsening symptoms that have not responded to OTC medications. Will give augmentin as below. Continue allergy medications. Stay well hydrated and get plenty of rest. Call if no symptom improvement or if symptoms worsen.  Follow Up Instructions: I discussed the assessment and treatment plan with the patient. The patient was provided an opportunity to ask questions and all were answered. The patient agreed with the plan and demonstrated an understanding of the instructions.  A copy of instructions were sent to the patient via MyChart.  The patient was advised to call back or seek an in-person evaluation if the symptoms worsen or if the condition fails to improve as anticipated.  Time:  I spent 11 minutes with the patient via telehealth technology discussing the above problems/concerns.    Margaretann Loveless, PA-C

## 2021-04-07 NOTE — Progress Notes (Signed)
Virtual Visit via Video Note  I connected with Sandra Dawson on 04/11/21 at 11:30 AM EDT by a video enabled telemedicine application and verified that I am speaking with the correct person using two identifiers.  Location: Patient: home Provider: office Persons participated in the visit- patient, provider    I discussed the limitations of evaluation and management by telemedicine and the availability of in person appointments. The patient expressed understanding and agreed to proceed.    I discussed the assessment and treatment plan with the patient. The patient was provided an opportunity to ask questions and all were answered. The patient agreed with the plan and demonstrated an understanding of the instructions.   The patient was advised to call back or seek an in-person evaluation if the symptoms worsen or if the condition fails to improve as anticipated.  I provided 18 minutes of non-face-to-face time during this encounter.   Neysa Hotter, MD    [facility] MD/PA/NP OP Progress Note  04/11/2021 11:53 AM Sandra Dawson  MRN:  [id number]  Chief Complaint:  Chief Complaint   Follow-up; Other    HPI:  This is a follow-up appointment for OCD and depression.  She states that she has been doing very well.  She thinks her anxiety has been less.  She has been able to take shower every other day.  She has started to listen to music while taking a shower to make it more enjoyable.  Although it takes 1-1.5 hour to take a shower, she states that it is not bad.  She also does not like grocery, and has less fear of contamination.  She has started to work on purposely to think what her family would do when she had thoughts of things are dirty.  She feels that having an awareness has been very helpful in many aspects.  She has started to go out more with her friends, and enjoys a time with them.  She broke up with her boyfriend about a month ago.  He still lives in the same apartment.  She is  planning to move to [facility] in October.  She has occasional insomnia.  She partly attributes it to having a pierce.  Although she gained a few pounds, she thinks it is because of going out more frequently.  She is trying to work on [employer].  She denies feeling depressed or anhedonia.  She denies SI.  She feels irritable at times, although it has become less.  Although she had worsening in inattention when she could not get Vyvanse due to insurance issues , it has been improving since she restarted the medication.  She denies panic attacks.  She feels comfortable to stay on her medication.    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 Mixed obsessional thoughts and acts  F42.2     2. Major depressive disorder with single episode, in partial remission (HCC)  F32.4       Past Psychiatric History: Please see initial  evaluation for full details. I have reviewed the history. No updates at this time.     Past Medical History:  Past Medical History:  Diagnosis Date   Acid reflux    ADHD (attention deficit hyperactivity disorder)    History of chicken pox     Past Surgical History:  Procedure Laterality Date   MYRINGOTOMY WITH TUBE PLACEMENT     NO PAST SURGERIES      Family Psychiatric History: Please see initial evaluation for full details. I have reviewed the history. No updates at this time.     Family History:  Family History  Problem Relation Age of Onset   Hyperlipidemia Father    Diabetes Father     ADD / ADHD Sister    Depression Sister    Eating disorder Sister    Pulmonary fibrosis Maternal Grandmother    Lung cancer Maternal Grandfather    Dementia Maternal Grandfather    Bladder Cancer Paternal Grandmother    Heart disease Paternal Grandfather    Heart attack Paternal Grandfather    Healthy Mother     Social History:  Social History   Socioeconomic History   Marital status: Single    Spouse name: Not on file   Number of children: 0   Years of education: Not on file   Highest education level: Not on file  Occupational History   Occupation: Management consultant  Tobacco Use   Smoking status: Never   Smokeless tobacco: Never  Vaping Use   Vaping Use: Never used  Substance and Sexual Activity   Alcohol use: Yes    Alcohol/week: 3.0 standard drinks    Types: 3 Cans of beer per week   Drug use: Never   Sexual activity: Yes    Birth control/protection: Pill  Other Topics Concern   Not on file  Social History Narrative   In college - Production designer, theatre/television/film regularly   Social Determinants of Health   Financial Resource Strain: Not on file  Food Insecurity: Not on file  Transportation Needs: Not on file  Physical Activity: Not on file  Stress: Not on file  Social Connections: Not on file    Allergies: No Known Allergies  Metabolic Disorder Labs: No results found for: HGBA1C, MPG No results found for: PROLACTIN Lab Results  Component Value Date   CHOL 169 01/26/2014   TRIG 73.0 01/26/2014   HDL 62.00 01/26/2014   CHOLHDL 3 01/26/2014   VLDL 14.6 01/26/2014   LDLCALC 92 01/26/2014   Lab Results  Component Value Date   TSH 2.02 01/26/2014    Therapeutic Level Labs: No results found for: LITHIUM No results found for: VALPROATE No components found for:  CBMZ  Current Medications: Current Outpatient Medications  Medication Sig Dispense Refill   amoxicillin-clavulanate (AUGMENTIN) 875-125 MG tablet Take 1 tablet by mouth 2  (two) times daily. 20 tablet 0   [START ON 05/26/2021] escitalopram (LEXAPRO) 10 MG tablet Take 1 tablet (10 mg total) by mouth daily. 90 tablet 0   fluticasone (FLONASE) 50 MCG/ACT nasal spray Place 2 sprays into both nostrils daily. 16 g 6   levonorgestrel-ethinyl estradiol (ALESSE) 0.1-20 MG-MCG tablet TAKE 1 TABLET BY MOUTH ONCE DAILY 84 tablet 4   lisdexamfetamine (VYVANSE) 30 MG capsule Take 1 capsule (30 mg total) by mouth daily. 30 capsule 0   Multiple Vitamin (MULTIVITAMIN) tablet Take 1 tablet by mouth daily.     pantoprazole (PROTONIX) 40 MG tablet Take  1 tablet (40 mg total) by mouth daily. 30 tablet 11   No current facility-administered medications for this visit.     Musculoskeletal: Strength & Muscle Tone:  N/A Gait & Station:  N/A Patient leans: N/A  Psychiatric Specialty Exam: Review of Systems  Psychiatric/Behavioral:  Positive for sleep disturbance. Negative for agitation, behavioral problems, confusion, decreased concentration, dysphoric mood, hallucinations, self-injury and suicidal ideas. The patient is nervous/anxious. The patient is not hyperactive.   All other systems reviewed and are negative.  There were no vitals taken for this visit.There is no height or weight on file to calculate BMI.  General Appearance: Fairly Groomed  Eye Contact:  Good  Speech:  Clear and Coherent  Volume:  Normal  Mood:   good  Affect:  Appropriate, Congruent, and euthymic, smiles  Thought Process:  Coherent  Orientation:  Full (Time, Place, and Person)  Thought Content: Logical   Suicidal Thoughts:  No  Homicidal Thoughts:  No  Memory:  Immediate;   Good  Judgement:  Good  Insight:  Good  Psychomotor Activity:  Normal  Concentration:  Concentration: Good and Attention Span: Good  Recall:  Good  Fund of Knowledge: Good  Language: Good  Akathisia:  No  Handed:  Right  AIMS (if indicated): not done  Assets:  Communication Skills Desire for Improvement  ADL's:  Intact   Cognition: WNL  Sleep:  Fair   Screenings: PHQ2-9    Flowsheet Row Video [facility]11[facility]2021 [facility] Office Visit from 12/07/2020 in [facility] Nutrition from 04/01/2014 in [facility]  PHQ-2 Total Score 0 3 0  PHQ-9 Total Score -- 9 --      Flowsheet Row Video Visit from 04/11/2021 in [facility] Video Visit from 01/04/2021 in [facility]  C-SSRS RISK CATEGORY No Risk No Risk        Assessment and Plan:  Sandra ColeMadison N Vora is a 25 y.o. year old female with a history of ,OCD, anxiety, history of ADHD,  who presents for follow up appointment for below.   1. Mixed obsessional thoughts and acts 2. Major depressive disorder with single episode, in partial remission (HCC) She reports overall improvement in OCD and depressive symptoms since the last visit.  Psychosocial stressors includes break-up with her boyfriend, social distance due to pandemic.  She has been going out more with her friends, and reports good connection with them.  Will continue Lexapro to target OCD, anxiety and depression.    # history of ADHD Unchanged. She sees [place] and Building services engineerattentional specialist.  Although she reports slight worsening in inattention when she was not able to feel medication due to insurance issues, it has been improving since she restarted her medication.   Plan I have reviewed and updated plans as below  1. Continue lexapro 10 mg daily 2. Consider seeing PCP to check labs (TSH, Hb) to rule out medical condition associated with mood symptoms 3. Next appointment- 10/21 at 11 AM, video - on Vyvanse 20 mg daily      The patient demonstrates the following risk factors for suicide: Chronic risk factors for suicide include: psychiatric disorder of anxiety. Acute risk factors for suicide include: N/A. Protective factors for this patient include:  positive social support, responsibility to others (children, family), coping skills and hope for the future. Considering these factors, the overall suicide risk at this point appears to be low. Patient is appropriate for outpatient follow  up.          Neysa Hotter, MD 04/11/2021, 11:53 AM

## 2021-04-11 ENCOUNTER — Encounter: Payer: Self-pay | Admitting: Psychiatry

## 2021-04-11 ENCOUNTER — Other Ambulatory Visit: Payer: Self-pay

## 2021-04-11 ENCOUNTER — Telehealth (INDEPENDENT_AMBULATORY_CARE_PROVIDER_SITE_OTHER): Payer: Self-pay | Admitting: Psychiatry

## 2021-04-11 DIAGNOSIS — F324 Major depressive disorder, single episode, in partial remission: Secondary | ICD-10-CM

## 2021-04-11 DIAGNOSIS — F422 Mixed obsessional thoughts and acts: Secondary | ICD-10-CM

## 2021-04-11 MED ORDER — ESCITALOPRAM OXALATE 10 MG PO TABS: 10.0000 mg | ORAL_TABLET | Freq: Every day | ORAL | 0 refills | Status: DC

## 2021-04-11 NOTE — Patient Instructions (Signed)
1. Continue lexapro 10 mg daily 2. Consider seeing PCP to check labs (TSH, Hb) to rule out medical condition associated with mood symptoms 3. Next appointment- 10/21 at 11 AM

## 2021-05-29 NOTE — Telephone Encounter (Signed)
Talked with the pharmacy. There is a refill ordered a few months ago.

## 2021-06-07 NOTE — Progress Notes (Signed)
Virtual Visit via Video Note  I connected with Sandra Dawson on 06/09/21 at 11:00 AM EDT by a video enabled telemedicine application and verified that I am speaking with the correct person using two identifiers.  Location: Patient: home Provider: office Persons participated in the visit- patient, provider    I discussed the limitations of evaluation and management by telemedicine and the availability of in person appointments. The patient expressed understanding and agreed to proceed.     I discussed the assessment and treatment plan with the patient. The patient was provided an opportunity to ask questions and all were answered. The patient agreed with the plan and demonstrated an understanding of the instructions.   The patient was advised to call back or seek an in-person evaluation if the symptoms worsen or if the condition fails to improve as anticipated.  I provided 15 minutes of non-face-to-face time during this encounter.   Neysa Hotter, MD    [facility] MD/PA/NP OP Progress Note  06/09/2021 11:30 AM Sandra Dawson  MRN:  [id number]  Chief Complaint:  Chief Complaint   Follow-up; Anxiety    HPI:  This is a follow-up appointment for OCD, depression and anxiety.  She states that she will move in to [place] in 8 days.  Although there has been occasional flare ups of anxiety due to the situation, it has been manageable.  She also feels good that she would move closer to her workplace.  She takes a shower 15 to 30 minutes, which has been significant improvement compared to several months ago.  She also finds it more relaxing.  She has less time on hand washing, which takes about 5 minutes.  She enjoys going out with her friends more frequently.  Although there was a time she felt sad around the situation with her friend, she feels good otherwise.  She had an argument with her father, who tried to have curfew when they went on to the beach.  She is planning to have a  conversation after things have settled.  She and her ex-boyfriend continues to live together.  She believes she will sleep better when she moved out, and has her own bed.  Although she gained a few pounds, she attributes this to eating more outside.  She will be working on diet, and is planning to go to gym after relocation.  She has fair concentration.  She denies SI.  She denies panic attacks.  There was one occasion she drank to the point of she broke her glass.  She denies daily alcohol use.  She denies drug use. She feels comfortable to stay on Lexapro.   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 Mixed obsessional thoughts and acts  F42.2     2. Major depressive disorder with single episode, in partial remission (HCC)  F32.4     3. Anxiety state  F41.1       Past Psychiatric History: Please see initial evaluation for full details. I have reviewed the history. No updates at this time.     Past Medical History:  Past Medical History:  Diagnosis Date   Acid reflux    ADHD (attention deficit  hyperactivity disorder)    History of chicken pox     Past Surgical History:  Procedure Laterality Date   MYRINGOTOMY WITH TUBE PLACEMENT     NO PAST SURGERIES      Family Psychiatric History: Please see initial evaluation for full details. I have reviewed the history. No updates at this time.     Family History:  Family History  Problem Relation Age of Onset   Hyperlipidemia Father    Diabetes Father    ADD / ADHD Sister    Depression Sister    Eating disorder Sister    Pulmonary fibrosis Maternal Grandmother    Lung cancer Maternal Grandfather     Dementia Maternal Grandfather    Bladder Cancer Paternal Grandmother    Heart disease Paternal Grandfather    Heart attack Paternal Grandfather    Healthy Mother     Social History:  Social History   Socioeconomic History   Marital status: Single    Spouse name: Not on file   Number of children: 0   Years of education: Not on file   Highest education level: Not on file  Occupational History   Occupation: Management consultant  Tobacco Use   Smoking status: Never   Smokeless tobacco: Never  Vaping Use   Vaping Use: Never used  Substance and Sexual Activity   Alcohol use: Yes    Alcohol/week: 3.0 standard drinks    Types: 3 Cans of beer per week   Drug use: Never   Sexual activity: Yes    Birth control/protection: Pill  Other Topics Concern   Not on file  Social History Narrative   In college - Production designer, theatre/television/film regularly   Social Determinants of Health   Financial Resource Strain: Not on file  Food Insecurity: Not on file  Transportation Needs: Not on file  Physical Activity: Not on file  Stress: Not on file  Social Connections: Not on file    Allergies: No Known Allergies  Metabolic Disorder Labs: No results found for: HGBA1C, MPG No results found for: PROLACTIN Lab Results  Component Value Date   CHOL 169 01/26/2014   TRIG 73.0 01/26/2014   HDL 62.00 01/26/2014   CHOLHDL 3 01/26/2014   VLDL 14.6 01/26/2014   LDLCALC 92 01/26/2014   Lab Results  Component Value Date   TSH 2.02 01/26/2014    Therapeutic Level Labs: No results found for: LITHIUM No results found for: VALPROATE No components found for:  CBMZ  Current Medications: Current Outpatient Medications  Medication Sig Dispense Refill   amoxicillin-clavulanate (AUGMENTIN) 875-125 MG tablet Take 1 tablet by mouth 2 (two) times daily. 20 tablet 0   escitalopram (LEXAPRO) 10 MG tablet Take 1 tablet (10 mg total) by mouth daily. 90 tablet 0   fluticasone (FLONASE)  50 MCG/ACT nasal spray Place 2 sprays into both nostrils daily. 16 g 6   levonorgestrel-ethinyl estradiol (ALESSE) 0.1-20 MG-MCG tablet TAKE 1 TABLET BY MOUTH ONCE DAILY 84 tablet 4   lisdexamfetamine (VYVANSE) 30 MG capsule Take 1 capsule (30 mg total) by mouth daily. 30 capsule 0   Multiple Vitamin (MULTIVITAMIN) tablet Take 1 tablet by mouth daily.     pantoprazole (PROTONIX) 40 MG tablet Take 1 tablet (40 mg total) by mouth daily. 30 tablet 11   No current facility-administered medications for this visit.     Musculoskeletal: Strength & Muscle Tone:  N/A Gait & Station:  N/A Patient leans: N/A  Psychiatric  Specialty Exam: Review of Systems  Psychiatric/Behavioral:  Positive for dysphoric mood. Negative for agitation, behavioral problems, confusion, decreased concentration, hallucinations, self-injury, sleep disturbance and suicidal ideas. The patient is nervous/anxious. The patient is not hyperactive.   All other systems reviewed and are negative.  There were no vitals taken for this visit.There is no height or weight on file to calculate BMI.  General Appearance: Fairly Groomed  Eye Contact:  Good  Speech:  Clear and Coherent  Volume:  Normal  Mood:   good  Affect:  Appropriate, Congruent, and Full Range  Thought Process:  Coherent  Orientation:  Full (Time, Place, and Person)  Thought Content: Logical   Suicidal Thoughts:  No  Homicidal Thoughts:  No  Memory:  Immediate;   Good  Judgement:  Good  Insight:  Good  Psychomotor Activity:  Normal  Concentration:  Concentration: Good and Attention Span: Good  Recall:  Good  Fund of Knowledge: Good  Language: Good  Akathisia:  No  Handed:  Right  AIMS (if indicated): not done  Assets:  Communication Skills Desire for Improvement  ADL's:  Intact  Cognition: WNL  Sleep:  Good   Screenings: PHQ2-9    Flowsheet Row Video Visit from 06/09/2021 in [facility] Video Visit from 04/11/2021 in  [facility] Office Visit from 12/07/2020 in [facility] Nutrition from 04/01/2014 in [facility]  PHQ-2 Total Score 1 0 3 0  PHQ-9 Total Score -- -- 9 --      Flowsheet Row Video Visit from 06/09/2021 in [facility] Video Visit from 04/11/2021 in [facility] Video Visit from 01/04/2021 in [facility]  C-SSRS RISK CATEGORY No Risk No Risk No Risk        Assessment and Plan:  Sandra Dawson is a 25 y.o. year old female with a history of OCD, anxiety, history of ADHD , who presents for follow up appointment for below.    1. Mixed obsessional thoughts and acts 2. Major depressive disorder with single episode, in partial remission (HCC) # Anxiety state There has been overall improvement in OCD and depressive symptoms since the last visit.  Psychosocial stressors includes breaking up with her boyfriend, upcoming relocation.  She enjoys connection with her friends.  Will continue current dose of Lexapro to target OCD, depression and anxiety.    # history of ADHD Unchanged. She sees [place] and Building services engineer.     Plan Continue Lexapro 10 mg daily  She was advised to check labs (TSH, Hb) to rule out medical condition which can cause mood symptoms Next appointment- 1/3 at 11 AM for 30 mins, video - on Vyvanse 20 mg daily      The patient demonstrates the following risk factors for suicide: Chronic risk factors for suicide include: psychiatric disorder of anxiety. Acute risk factors for suicide include: N/A. Protective factors for this patient include: positive social support, responsibility to others (children, family), coping skills and hope for the future. Considering these factors, the overall suicide risk at this point appears to be low. Patient is appropriate for outpatient follow up.      Neysa Hotter,  MD 06/09/2021, 11:30 AM

## 2021-06-09 ENCOUNTER — Other Ambulatory Visit: Payer: Self-pay

## 2021-06-09 ENCOUNTER — Telehealth (INDEPENDENT_AMBULATORY_CARE_PROVIDER_SITE_OTHER): Payer: Self-pay | Admitting: Psychiatry

## 2021-06-09 ENCOUNTER — Encounter: Payer: Self-pay | Admitting: Psychiatry

## 2021-06-09 DIAGNOSIS — F411 Generalized anxiety disorder: Secondary | ICD-10-CM

## 2021-06-09 DIAGNOSIS — F324 Major depressive disorder, single episode, in partial remission: Secondary | ICD-10-CM

## 2021-06-09 DIAGNOSIS — F422 Mixed obsessional thoughts and acts: Secondary | ICD-10-CM

## 2021-06-09 NOTE — Patient Instructions (Signed)
Continue Lexapro 10 mg daily  Check labs (TSH, Hb)  Next appointment- 1/3 at 11 AM, video

## 2021-07-18 ENCOUNTER — Telehealth: Payer: Self-pay | Admitting: Physician Assistant

## 2021-07-18 DIAGNOSIS — B9789 Other viral agents as the cause of diseases classified elsewhere: Secondary | ICD-10-CM

## 2021-07-18 DIAGNOSIS — J019 Acute sinusitis, unspecified: Secondary | ICD-10-CM

## 2021-07-18 MED ORDER — IPRATROPIUM BROMIDE 0.03 % NA SOLN: 2.0000 | Freq: Two times a day (BID) | NASAL | 0 refills | Status: AC

## 2021-07-18 MED ORDER — BENZONATATE 100 MG PO CAPS: 100.0000 mg | ORAL_CAPSULE | Freq: Three times a day (TID) | ORAL | 0 refills | Status: DC | PRN

## 2021-07-18 MED ORDER — PSEUDOEPH-BROMPHEN-DM 30-2-10 MG/5ML PO SYRP: 5.0000 mL | ORAL_SOLUTION | Freq: Four times a day (QID) | ORAL | 0 refills | Status: DC | PRN

## 2021-07-18 NOTE — Progress Notes (Signed)
E-Visit for Sinus Problems  We are sorry that you are not feeling well.  Here is how we plan to help!  Based on what you have shared with me it looks like you have sinusitis.  Sinusitis is inflammation and infection in the sinus cavities of the head.  Based on your presentation I believe you most likely have Acute Viral Sinusitis.This is an infection most likely caused by a virus. There is not specific treatment for viral sinusitis other than to help you with the symptoms until the infection runs its course.  You may use an oral decongestant such as Mucinex D or if you have glaucoma or high blood pressure use plain Mucinex. Saline nasal spray help and can safely be used as often as needed for congestion, I have prescribed: Ipratropium Bromide nasal spray 0.03% 2 sprays in eah nostril 2-3 times a day, Bromfed DM 64mL every 6 hours as needed for cough, tessalon perles 1 capsule three times daily as needed for cough  Some authorities believe that zinc sprays or the use of Echinacea may shorten the course of your symptoms.  Sinus infections are not as easily transmitted as other respiratory infection, however we still recommend that you avoid close contact with loved ones, especially the very young and elderly.  Remember to wash your hands thoroughly throughout the day as this is the number one way to prevent the spread of infection!  Home Care: Only take medications as instructed by your medical team. Do not take these medications with alcohol. A steam or ultrasonic humidifier can help congestion.  You can place a towel over your head and breathe in the steam from hot water coming from a faucet. Avoid close contacts especially the very young and the elderly. Cover your mouth when you cough or sneeze. Always remember to wash your hands.  Get Help Right Away If: You develop worsening fever or sinus pain. You develop a severe head ache or visual changes. Your symptoms persist after you have completed  your treatment plan.  Make sure you Understand these instructions. Will watch your condition. Will get help right away if you are not doing well or get worse.   Thank you for choosing an e-visit.  Your e-visit answers were reviewed by a board certified advanced clinical practitioner to complete your personal care plan. Depending upon the condition, your plan could have included both over the counter or prescription medications.  Please review your pharmacy choice. Make sure the pharmacy is open so you can pick up prescription now. If there is a problem, you may contact your provider through [employer] and have the prescription routed to another pharmacy.  Your safety is important to [place]. If you have drug allergies check your prescription carefully.   For the next 24 hours you can use MyChart to ask questions about today's visit, request a non-urgent call back, or ask for a work or school excuse. You will get an email in the next two days asking about your experience. I hope that your e-visit has been valuable and will speed your recovery.  I provided 7 minutes of non face-to-face time during this encounter for chart review and documentation.

## 2021-08-20 NOTE — Progress Notes (Signed)
Virtual Visit via Video Note  I connected with Sandra Dawson on 08/22/21 at 11:00 AM EST by a video enabled telemedicine application and verified that I am speaking with the correct person using two identifiers.  Location: Patient: home Provider: office Persons participated in the visit- patient, provider    I discussed the limitations of evaluation and management by telemedicine and the availability of in person appointments. The patient expressed understanding and agreed to proceed.     I discussed the assessment and treatment plan with the patient. The patient was provided an opportunity to ask questions and all were answered. The patient agreed with the plan and demonstrated an understanding of the instructions.   The patient was advised to call back or seek an in-person evaluation if the symptoms worsen or if the condition fails to improve as anticipated.  I provided 21 minutes of non-face-to-face time during this encounter.   Neysa Hotter, MD    [facility] MD/PA/NP OP Progress Note  08/22/2021 11:31 AM Sandra Dawson  MRN:  [id number]  Chief Complaint:  Chief Complaint   Follow-up; Anxiety    HPI:  This is a follow-up appointment for OCD, depression and anxiety.  She states that now she moved to [place], and lives by herself.  This is completely a positive change for her.  She has been trying to relearn about herself, stating that she was in 7 years of relationship.  She has started to make friends in the area.  She lost her grandfather the day before Thanksgiving.  Although it was hard for a few days, she reports reconnecting with her family members, who she has not been able to meet for a few years due to COVID.  She finds it helpful to celebrate his life instead of thinking if this as a loss.  She thinks [place] has been getting much better; although she used to be reluctant to take a shower, she now tries to make it as enjoyable experience.  She takes 30 minutes for  shower, and she feels proud of this change.  She does deep cleaning every other week.  She denies obsessive thoughts.  She sleeps better now that she does not need to sleep in the couch since relocation.  She has good appetite.  She is trying to work on a healthy diet and an exercise.  She denies feeling depressed except the time she lost her grandfather.  She denies anhedonia.  She has good energy.  She has fair concentration.  She denies SI.  Although she feels anxious at times, it has been manageable.  She denies panic attacks.  She feels comfortable to stay on the current dose of Lexapro.   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 Mixed obsessional thoughts and acts  F42.2     2. Major depressive disorder with single episode, in partial remission (HCC)  F32.4     3. Anxiety state  F41.1       Past Psychiatric History: Please see initial evaluation for full details. I have reviewed the history. No updates at this time.     Past Medical History:  Past Medical History:  Diagnosis Date  Acid reflux    ADHD (attention deficit hyperactivity disorder)    History of chicken pox     Past Surgical History:  Procedure Laterality Date   MYRINGOTOMY WITH TUBE PLACEMENT     NO PAST SURGERIES      Family Psychiatric History: Please see initial evaluation for full details. I have reviewed the history. No updates at this time.     Family History:  Family History  Problem Relation Age of Onset   Hyperlipidemia Father    Diabetes Father    ADD / ADHD Sister    Depression Sister    Eating disorder Sister    Pulmonary fibrosis Maternal Grandmother     Lung cancer Maternal Grandfather    Dementia Maternal Grandfather    Bladder Cancer Paternal Grandmother    Heart disease Paternal Grandfather    Heart attack Paternal Grandfather    Healthy Mother     Social History:  Social History   Socioeconomic History   Marital status: Single    Spouse name: Not on file   Number of children: 0   Years of education: Not on file   Highest education level: Not on file  Occupational History   Occupation: Management consultantGraphic/structural design  Tobacco Use   Smoking status: Never   Smokeless tobacco: Never  Vaping Use   Vaping Use: Never used  Substance and Sexual Activity   Alcohol use: Yes    Alcohol/week: 3.0 standard drinks    Types: 3 Cans of beer per week   Drug use: Never   Sexual activity: Yes    Birth control/protection: Pill  Other Topics Concern   Not on file  Social History Narrative   In college - Production designer, theatre/television/filmstudying graphic design      Exercises regularly   Social Determinants of Health   Financial Resource Strain: Not on file  Food Insecurity: Not on file  Transportation Needs: Not on file  Physical Activity: Not on file  Stress: Not on file  Social Connections: Not on file    Allergies: No Known Allergies  Metabolic Disorder Labs: No results found for: HGBA1C, MPG No results found for: PROLACTIN Lab Results  Component Value Date   CHOL 169 01/26/2014   TRIG 73.0 01/26/2014   HDL 62.00 01/26/2014   CHOLHDL 3 01/26/2014   VLDL 14.6 01/26/2014   LDLCALC 92 01/26/2014   Lab Results  Component Value Date   TSH 2.02 01/26/2014    Therapeutic Level Labs: No results found for: LITHIUM No results found for: VALPROATE No components found for:  CBMZ  Current Medications: Current Outpatient Medications  Medication Sig Dispense Refill   benzonatate (TESSALON) 100 MG capsule Take 1 capsule (100 mg total) by mouth 3 (three) times daily as needed. 30 capsule 0   brompheniramine-pseudoephedrine-DM 30-2-10 MG/5ML syrup Take 5 mLs by  mouth 4 (four) times daily as needed. 120 mL 0   escitalopram (LEXAPRO) 10 MG tablet Take 1 tablet (10 mg total) by mouth daily. 90 tablet 0   ipratropium (ATROVENT) 0.03 % nasal spray Place 2 sprays into both nostrils every 12 (twelve) hours. 30 mL 0   levonorgestrel-ethinyl estradiol (ALESSE) 0.1-20 MG-MCG tablet TAKE 1 TABLET BY MOUTH ONCE DAILY 84 tablet 4   Multiple Vitamin (MULTIVITAMIN) tablet Take 1 tablet by mouth daily.     pantoprazole (PROTONIX) 40 MG tablet Take 1 tablet (40 mg total) by mouth daily. 30 tablet 11   No current facility-administered medications for this visit.  Musculoskeletal: Strength & Muscle Tone:  N/A Gait & Station:  N/A Patient leans: N/A  Psychiatric Specialty Exam: Review of Systems  Psychiatric/Behavioral:  Positive for decreased concentration. Negative for agitation, behavioral problems, confusion, dysphoric mood, hallucinations, self-injury, sleep disturbance and suicidal ideas. The patient is nervous/anxious. The patient is not hyperactive.   All other systems reviewed and are negative.  There were no vitals taken for this visit.There is no height or weight on file to calculate BMI.  General Appearance: Fairly Groomed  Eye Contact:  Good  Speech:  Clear and Coherent  Volume:  Normal  Mood:   good  Affect:  Appropriate, Congruent, and Full Range  Thought Process:  Coherent  Orientation:  Full (Time, Place, and Person)  Thought Content: Logical   Suicidal Thoughts:  No  Homicidal Thoughts:  No  Memory:  Immediate;   Good  Judgement:  Good  Insight:  Good  Psychomotor Activity:  Normal  Concentration:  Concentration: Good and Attention Span: Good  Recall:  Good  Fund of Knowledge: Good  Language: Good  Akathisia:  No  Handed:  Right  AIMS (if indicated): not done  Assets:  Communication Skills Desire for Improvement  ADL's:  Intact  Cognition: WNL  Sleep:  Fair   Screenings: PHQ2-9    Flowsheet Row Video Visit from  06/09/2021 in [facility] Video Visit from 04/11/2021 in [facility] Office Visit from 12/07/2020 in [facility] Nutrition from 04/01/2014 in [facility]  PHQ-2 Total Score 1 0 3 0  PHQ-9 Total Score -- -- 9 --      Flowsheet Row Video Visit from 06/09/2021 in [facility] Video Visit from 04/11/2021 in [facility] Video Visit from 01/04/2021 in [facility]  C-SSRS RISK CATEGORY No Risk No Risk No Risk        Assessment and Plan:  Sandra Dawson is a 26 y.o. year old female with a history of OCD, anxiety, history of ADHD, who presents for follow up appointment for below.    1. Mixed obsessional thoughts and acts 2. Major depressive disorder with single episode, in partial remission (HCC) 3. Anxiety state There has been steady improvement in OCD, depressive and anxiety symptoms in the context of recent relocation.  Recent psychosocial stressors includes loss of her grandfather.  She enjoys connection with her friends, and reports good relationship with her family.  Will continue current dose of Lexapro to target OCD, depression and anxiety.   # history of ADHD Unchanged. She sees [place] and Building services engineer.      Plan Continue Lexapro 10 mg daily  She was advised to check labs (TSH, Hb) to rule out medical condition which can cause mood symptoms Next appointment- 3/27 at 9:30 for 30 mins, in person - on Vyvanse 20 mg daily      The patient demonstrates the following risk factors for suicide: Chronic risk factors for suicide include: psychiatric disorder of anxiety. Acute risk factors for suicide include: N/A. Protective factors for this patient include: positive social support, responsibility to others (children, family), coping skills and hope for the future. Considering these  factors, the overall suicide risk at this point appears to be low. Patient is appropriate for outpatient follow up.          Neysa Hotter, MD 08/22/2021, 11:31 AM

## 2021-08-22 ENCOUNTER — Encounter: Payer: Self-pay | Admitting: Psychiatry

## 2021-08-22 ENCOUNTER — Other Ambulatory Visit: Payer: Self-pay

## 2021-08-22 ENCOUNTER — Telehealth (INDEPENDENT_AMBULATORY_CARE_PROVIDER_SITE_OTHER): Payer: BC Managed Care – PPO | Admitting: Psychiatry

## 2021-08-22 DIAGNOSIS — F422 Mixed obsessional thoughts and acts: Secondary | ICD-10-CM

## 2021-08-22 DIAGNOSIS — F324 Major depressive disorder, single episode, in partial remission: Secondary | ICD-10-CM | POA: Diagnosis not present

## 2021-08-22 DIAGNOSIS — F411 Generalized anxiety disorder: Secondary | ICD-10-CM

## 2021-08-22 MED ORDER — ESCITALOPRAM OXALATE 10 MG PO TABS: 10.0000 mg | ORAL_TABLET | Freq: Every day | ORAL | 0 refills | Status: DC

## 2021-08-22 NOTE — Patient Instructions (Addendum)
Continue Lexapro 10 mg daily  She was advised to check labs (TSH, Hb) to rule out medical condition which can cause mood symptoms Next appointment- 3/27 at 9:30, in person  The next visit will be in person visit. Please arrive 15 mins before the scheduled time.   [facility]  Address: [address], [place], [place] [address]

## 2021-08-29 ENCOUNTER — Other Ambulatory Visit: Payer: Self-pay | Admitting: Psychiatry

## 2021-10-24 ENCOUNTER — Other Ambulatory Visit: Payer: Self-pay | Admitting: Psychiatry

## 2021-11-08 NOTE — Progress Notes (Signed)
BH MD/PA/NP OP Progress Note ? ?11/13/2021 10:46 AM ?Sandra Dawson  ?MRN:  [id number] ? ?Chief Complaint:  ?Chief Complaint  ?Patient presents with  ? Establish Care  ? ?HPI:  ?This is a follow-up appointment for OCD, depression.  ?She states that there was an incident of incontinence when she had drinking alcohol.  She was stressed relationships.  She drank several shots at home.  She denies any other habitual alcohol use, and she tries to limit the amount due to the incident.  She now sees a man, who she shared about her mental health condition.  She was very anxious when people rushes for the meeting.  There was also worsening in anxiety when she had incontinence.  However, she now has a dog, and she enjoys being with her dog.  She did not feel too much anxiety as before even when her dog was found to have tapeworm. She likes her current job/people are very supportive.  She reports feeling depressed at times. She has depressive symptoms as in PHQ-9.  She denies SI. She takes 30-40 mins to take a shower lately, which has been an improvement.  She denies drug use.  She notices that she has acne, hair around jaw for the past year.  She thinks it has started since being on Lexapro.  She has regular menstrual cycle.  She has slight increase in weight, she attributes to going to restaurants while dating.  After discussing treatment options, she agrees to try higher dose of Lexapro. She also agrees to establish care with her primary care provider.  ? ? ? ? ?Visit Diagnosis:  ?  ICD-10-CM   ?1. Mixed obsessional thoughts and acts  F42.2   ?  ?2. 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  ?3. Anxiety state  F41.1   ?  ? ? ?Past Psychiatric History: Please see initial evaluation for full details. I have reviewed the history. No updates at this time.  ?  ? ?Past Medical History:  ?Past Medical History:  ?Diagnosis Date  ? Acid reflux   ? ADHD  (attention deficit hyperactivity disorder)   ? History of chicken pox   ?  ?Past Surgical History:  ?Procedure Laterality Date  ? MYRINGOTOMY WITH TUBE PLACEMENT    ? NO PAST SURGERIES    ? ? ?Family Psychiatric History: Please see initial evaluation for full details. I have reviewed the history. No updates at this time.  ?  ? ?Family History:  ?Family History  ?Problem Relation Age of Onset  ? Hyperlipidemia Father   ? Diabetes Father   ? ADD / ADHD Sister   ? Depression Sister   ? Eating disorder Sister   ? Pulmonary fibrosis Maternal Grandmother   ? Lung cancer Maternal Grandfather   ? Dementia Maternal Grandfather   ? Bladder Cancer Paternal Grandmother   ? Heart disease Paternal Grandfather   ? Heart attack Paternal Grandfather   ? Healthy Mother   ? ? ?Social History:  ?Social History  ? ?Socioeconomic History  ? Marital status: Single  ?  Spouse name: Not on file  ? Number of children: 0  ? Years of education: Not on file  ? Highest education level: Not on file  ?Occupational History  ? Occupation: Information systems manager  ?Tobacco Use  ? Smoking status: Never  ? Smokeless tobacco: Never  ?Vaping Use  ? Vaping Use: Never used  ?Substance and Sexual  Activity  ? Alcohol use: Yes  ?  Alcohol/week: 3.0 standard drinks  ?  Types: 3 Cans of beer per week  ? Drug use: Never  ? Sexual activity: Yes  ?  Birth control/protection: Pill  ?Other Topics Concern  ? Not on file  ?Social History Narrative  ? In college - Psychologist, counselling  ?   ? Exercises regularly  ? ?Social Determinants of Health  ? ?Financial Resource Strain: Not on file  ?Food Insecurity: Not on file  ?Transportation Needs: Not on file  ?Physical Activity: Not on file  ?Stress: Not on file  ?Social Connections: Not on file  ? ? ?Allergies: No Known Allergies ? ?Metabolic Disorder Labs: ?No results found for: HGBA1C, MPG ?No results found for: PROLACTIN ?Lab Results  ?Component Value Date  ? CHOL 169 01/26/2014  ? TRIG 73.0 01/26/2014  ? HDL 62.00  01/26/2014  ? CHOLHDL 3 01/26/2014  ? VLDL 14.6 01/26/2014  ? [place] 92 01/26/2014  ? ?Lab Results  ?Component Value Date  ? TSH 2.02 01/26/2014  ? ? ?Therapeutic Level Labs: ?No results found for: LITHIUM ?No results found for: VALPROATE ?No components found for:  CBMZ ? ?Current Medications: ?Current Outpatient Medications  ?Medication Sig Dispense Refill  ? escitalopram (LEXAPRO) 10 MG tablet Take 1.5 tablets (15 mg total) by mouth daily. 135 tablet 0  ? ipratropium (ATROVENT) 0.03 % nasal spray Place 2 sprays into both nostrils every 12 (twelve) hours. 30 mL 0  ? levonorgestrel-ethinyl estradiol (ALESSE) 0.1-20 MG-MCG tablet TAKE 1 TABLET BY MOUTH ONCE DAILY 84 tablet 4  ? Multiple Vitamin (MULTIVITAMIN) tablet Take 1 tablet by mouth daily.    ? pantoprazole (PROTONIX) 40 MG tablet Take 1 tablet (40 mg total) by mouth daily. 30 tablet 11  ? ?No current facility-administered medications for this visit.  ? ? ? ?Musculoskeletal: ?Strength & Muscle Tone:  normal ?Gait & Station:  normal ?Patient leans: N/A ? ?Psychiatric Specialty Exam: ?Review of Systems  ?Psychiatric/Behavioral:  Positive for dysphoric mood. Negative for agitation, behavioral problems, confusion, decreased concentration, hallucinations, self-injury, sleep disturbance and suicidal ideas. The patient is nervous/anxious. The patient is not hyperactive.   ?All other systems reviewed and are negative.  ?Blood pressure (!) 144/84, pulse (!) 106, temperature 98.7 ?F (37.1 ?C), temperature source Temporal, weight 164 lb 12.8 oz (74.8 kg).Body mass index is 29.19 kg/m?.  ?General Appearance: Fairly Groomed  ?Eye Contact:  Good  ?Speech:  Clear and Coherent  ?Volume:  Normal  ?Mood:  Anxious and Depressed  ?Affect:  Appropriate, Congruent, and reactive  ?Thought Process:  Coherent  ?Orientation:  Full (Time, Place, and Person)  ?Thought Content: Logical   ?Suicidal Thoughts:  No  ?Homicidal Thoughts:  No  ?Memory:  Immediate;   Good  ?Judgement:  Good   ?Insight:  Good  ?Psychomotor Activity:  Normal  ?Concentration:  Concentration: Good and Attention Span: Good  ?Recall:  Good  ?Fund of Knowledge: Good  ?Language: Good  ?Akathisia:  No  ?Handed:  Right  ?AIMS (if indicated): not done  ?Assets:  Communication Skills ?Desire for Improvement  ?ADL's:  Intact  ?Cognition: WNL  ?Sleep:  Fair  ? ?Screenings: ?PHQ2-9   ? ?[place] Office Visit from 11/13/2021 in [place] Video Visit from 06/09/2021 in [place] Video Visit from 04/11/2021 in [place] Office Visit from 12/07/2020 in [place] [facility] from 04/01/2014 [facility]  ?  PHQ-2 Total Score 2 1 0 3 0  ?PHQ-9 Total Score 7 -- -- 9 --  ? ?  ? ?[place] Office Visit from 11/13/2021 in [place] Video Visit from 06/09/2021 in [place] Video Visit from 04/11/2021 in [place]  ?C-SSRS RISK CATEGORY No Risk No Risk No Risk  ? ?  ? ? ? ?Assessment and Plan:  ?Sandra Dawson is a 26 y.o. year old female with a history of  OCD, anxiety, history of ADHD , who presents for follow up appointment for below.  ?  ?1. Mixed obsessional thoughts and acts ?2. 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 Anxiety state ?There has been slight worsening in an OCD/depression and anxiety in the context of stress in relationships.  Other psychosocial stressors includes recent relocation, loss of her grandfather.  She continues to have good support from her family.  Will uptitrate Lexapro to target OCD, depression and anxiety.  ? ?# hirsutism ?# Acne ?She reports above symptoms since starting Lexapro.  She was encouraged to establish care with her PCP to rule out PCOS/any other medical condition contributing to this.   She was advised to contact the office if any worsening in symptoms after up titration of Lexapro.  ? ?# Alcohol use ?She reports 1 episode of binge alcohol use in the context of being stressed.  She is mot

## 2021-11-13 ENCOUNTER — Other Ambulatory Visit: Payer: Self-pay

## 2021-11-13 ENCOUNTER — Encounter: Payer: Self-pay | Admitting: Psychiatry

## 2021-11-13 ENCOUNTER — Ambulatory Visit (INDEPENDENT_AMBULATORY_CARE_PROVIDER_SITE_OTHER): Payer: BC Managed Care – PPO | Admitting: Psychiatry

## 2021-11-13 VITALS — BP 144/84 | HR 106 | Temp 98.7°F | Wt 164.8 lb

## 2021-11-13 DIAGNOSIS — F422 Mixed obsessional thoughts and acts: Secondary | ICD-10-CM | POA: Diagnosis not present

## 2021-11-13 DIAGNOSIS — F329 Major depressive disorder, single episode, unspecified: Secondary | ICD-10-CM

## 2021-11-13 DIAGNOSIS — F411 Generalized anxiety disorder: Secondary | ICD-10-CM | POA: Diagnosis not present

## 2021-11-13 MED ORDER — ESCITALOPRAM OXALATE 10 MG PO TABS: 15.0000 mg | ORAL_TABLET | Freq: Every day | ORAL | 0 refills | Status: DC

## 2021-11-13 NOTE — Patient Instructions (Signed)
Increase lexapro 15 mg daily ?Please make an appointment with primary care provider  ?Next appointment-  5/5 at 9 AM  ?

## 2021-12-02 ENCOUNTER — Other Ambulatory Visit: Payer: Self-pay | Admitting: Gastroenterology

## 2021-12-20 NOTE — Progress Notes (Addendum)
Virtual Visit via Video Note ? ?I connected with Sandra Dawson on 12/22/21 at  9:00 AM EDT by a video enabled telemedicine application and verified that I am speaking with the correct person using two identifiers. ? ?Location: ?Patient: home ?Provider: office ?Persons participated in the visit- patient, provider  ?  ?I discussed the limitations of evaluation and management by telemedicine and the availability of in person appointments. The patient expressed understanding and agreed to proceed. ? ?  ?I discussed the assessment and treatment plan with the patient. The patient was provided an opportunity to ask questions and all were answered. The patient agreed with the plan and demonstrated an understanding of the instructions. ?  ?The patient was advised to call back or seek an in-person evaluation if the symptoms worsen or if the condition fails to improve as anticipated. ? ?I provided 19 minutes of non-face-to-face time during this encounter. ? ? ?Neysa Hotter, MD ? ? ? ?BH MD/PA/NP OP Progress Note ? ?12/22/2021 9:31 AM ?Sandra Dawson  ?MRN:  [id number] ? ?Chief Complaint:  ?Chief Complaint  ?Patient presents with  ? Follow-up  ? Other  ? ?HPI:  ?This is a follow-up appointment for OCD, depression and anxiety.  ?She states that she has started to have a dog.  She thinks she has helped for her to be active and her OCD.  She also states that she did not have a panic attack when the water shuts off.  She has started to make friends in the apartment.  She also feels good that her boss commented that she has been doing better at work.  She has been trying to be proactive.  Although there were a few times she felt depressed at times without any reason, it subsided after hours.  She finds it helpful to have more support/friends. She is making priority on herself and her dog, job, and not dating anymore.  She sleeps better.  She has fair energy.  There is no change in her weight; she has been trying to work on diet,  referring to her used to eat in the restaurant when she was dating last year.  She denies SI.  It takes 20 to 30 minutes to take a shower, and she feels good about this.  She is not counting anymore.  She had a panic attack 2 months ago when the project is still.  Her anxiety is better otherwise.  She has just took higher dose of Lexapro today; she agrees to continue at this dose at this time. She continues to have acne, and hirsutism. She is planning to make a PCP appointment soon.  ? 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, graduated in 2018, majoring in Primary school teacher ? ? ?Visit Diagnosis:  ?  ICD-10-CM   ?1. Mixed obsessional thoughts and acts  F42.2   ?  ?2. Major depressive disorder with single episode, in partial remission (HCC)  F32.4   ?  ?3. Anxiety state  F41.1   ?  ? ? ?Past Psychiatric History: Please see initial evaluation for full details. I have reviewed the history. No updates at this time.  ?  ? ?Past Medical History:  ?Past Medical History:  ?Diagnosis Date  ? Acid reflux   ? ADHD (  attention deficit hyperactivity disorder)   ? History of chicken pox   ?  ?Past Surgical History:  ?Procedure Laterality Date  ? MYRINGOTOMY WITH TUBE PLACEMENT    ? NO PAST SURGERIES    ? ? ?Family Psychiatric History: Please see initial evaluation for full details. I have reviewed the history. No updates at this time.  ?  ? ?Family History:  ?Family History  ?Problem Relation Age of Onset  ? Hyperlipidemia Father   ? Diabetes Father   ? ADD / ADHD Sister   ? Depression Sister   ? Eating disorder Sister   ? Pulmonary fibrosis Maternal Grandmother   ? Lung cancer Maternal Grandfather   ? Dementia Maternal Grandfather   ? Bladder Cancer Paternal Grandmother   ?  Heart disease Paternal Grandfather   ? Heart attack Paternal Grandfather   ? Healthy Mother   ? ? ?Social History:  ?Social History  ? ?Socioeconomic History  ? Marital status: Single  ?  Spouse name: Not on file  ? Number of children: 0  ? Years of education: Not on file  ? Highest education level: Not on file  ?Occupational History  ? Occupation: Management consultantGraphic/structural design  ?Tobacco Use  ? Smoking status: Never  ? Smokeless tobacco: Never  ?Vaping Use  ? Vaping Use: Never used  ?Substance and Sexual Activity  ? Alcohol use: Yes  ?  Alcohol/week: 3.0 standard drinks  ?  Types: 3 Cans of beer per week  ? Drug use: Never  ? Sexual activity: Yes  ?  Birth control/protection: Pill  ?Other Topics Concern  ? Not on file  ?Social History Narrative  ? In college - Market researcherstudying graphic design  ?   ? Exercises regularly  ? ?Social Determinants of Health  ? ?Financial Resource Strain: Not on file  ?Food Insecurity: Not on file  ?Transportation Needs: Not on file  ?Physical Activity: Not on file  ?Stress: Not on file  ?Social Connections: Not on file  ? ? ?Allergies: No Known Allergies ? ?Metabolic Disorder Labs: ?No results found for: HGBA1C, MPG ?No results found for: PROLACTIN ?Lab Results  ?Component Value Date  ? CHOL 169 01/26/2014  ? TRIG 73.0 01/26/2014  ? HDL 62.00 01/26/2014  ? CHOLHDL 3 01/26/2014  ? VLDL 14.6 01/26/2014  ? LDLCALC 92 01/26/2014  ? ?Lab Results  ?Component Value Date  ? TSH 2.02 01/26/2014  ? ? ?Therapeutic Level Labs: ?No results found for: LITHIUM ?No results found for: VALPROATE ?No components found for:  CBMZ ? ?Current Medications: ?Current Outpatient Medications  ?Medication Sig Dispense Refill  ? escitalopram (LEXAPRO) 10 MG tablet Take 1.5 tablets (15 mg total) by mouth daily. 135 tablet 0  ? ipratropium (ATROVENT) 0.03 % nasal spray Place 2 sprays into both nostrils every 12 (twelve) hours. 30 mL 0  ? levonorgestrel-ethinyl estradiol (ALESSE) 0.1-20 MG-MCG tablet TAKE 1 TABLET BY MOUTH ONCE  DAILY 84 tablet 4  ? Multiple Vitamin (MULTIVITAMIN) tablet Take 1 tablet by mouth daily.    ? pantoprazole (PROTONIX) 40 MG tablet Take 1 tablet (40 mg total) by mouth daily. 30 tablet 11  ? ?No current facility-administered medications for this visit.  ? ? ? ?Musculoskeletal: ?Strength & Muscle Tone:  N/A ?Gait & Station:  N?A ?Patient leans: N/A ? ?Psychiatric Specialty Exam: ?Review of Systems  ?Psychiatric/Behavioral:  Positive for dysphoric mood. Negative for agitation, behavioral problems, confusion, decreased concentration, hallucinations, self-injury, sleep disturbance and suicidal ideas. The patient is nervous/anxious. The patient  is not hyperactive.   ?All other systems reviewed and are negative.  ?There were no vitals taken for this visit.There is no height or weight on file to calculate BMI.  ?General Appearance: Fairly Groomed  ?Eye Contact:  Good  ?Speech:  Clear and Coherent  ?Volume:  Normal  ?Mood:   good  ?Affect:  Appropriate, Congruent, and Full Range  ?Thought Process:  Coherent  ?Orientation:  Full (Time, Place, and Person)  ?Thought Content: Logical   ?Suicidal Thoughts:  No  ?Homicidal Thoughts:  No  ?Memory:  Immediate;   Good  ?Judgement:  Good  ?Insight:  Good  ?Psychomotor Activity:  Normal  ?Concentration:  Concentration: Good and Attention Span: Good  ?Recall:  Good  ?Fund of Knowledge: Good  ?Language: Good  ?Akathisia:  No  ?Handed:  Right  ?AIMS (if indicated): not done  ?Assets:  Communication Skills ?Desire for Improvement  ?ADL's:  Intact  ?Cognition: WNL  ?Sleep:  Good  ? ?Screenings: ?PHQ2-9   ? ?Flowsheet [place] Office Visit from 11/13/2021 in [facility] Video Visit from 06/09/2021 in [facility] Video Visit from 04/11/2021 in [facility] Office Visit from 12/07/2020 in [facility] Nutrition from 04/01/2014 in [facility]  ?PHQ-2 Total  Score 2 1 0 3 0  ?PHQ-9 Total Score 7 -- -- 9 --  ? ?  ? ?Flowsheet Row Office Visit from 11/13/2021 in [facility] Video Visit from 06/09/2021 in [facility]

## 2021-12-22 ENCOUNTER — Encounter: Payer: Self-pay | Admitting: Psychiatry

## 2021-12-22 ENCOUNTER — Telehealth (INDEPENDENT_AMBULATORY_CARE_PROVIDER_SITE_OTHER): Payer: BC Managed Care – PPO | Admitting: Psychiatry

## 2021-12-22 DIAGNOSIS — F411 Generalized anxiety disorder: Secondary | ICD-10-CM | POA: Diagnosis not present

## 2021-12-22 DIAGNOSIS — F324 Major depressive disorder, single episode, in partial remission: Secondary | ICD-10-CM

## 2021-12-22 DIAGNOSIS — F422 Mixed obsessional thoughts and acts: Secondary | ICD-10-CM | POA: Diagnosis not present

## 2021-12-22 NOTE — Patient Instructions (Signed)
Increase lexapro 15 mg daily - ?Next appointment-  6/23 at 8 AM ?

## 2022-02-05 NOTE — Progress Notes (Unsigned)
BH MD/PA/NP OP Progress Note  02/05/2022 10:51 AM Sandra Dawson  MRN:  [id number]  Chief Complaint: No chief complaint on file.  HPI: *** Visit Diagnosis: No diagnosis found.  Past Psychiatric History: Please see initial evaluation for full details. I have reviewed the history. No updates at this time.     Past Medical History:  Past Medical History:  Diagnosis Date   Acid reflux    ADHD (attention deficit hyperactivity disorder)    History of chicken pox     Past Surgical History:  Procedure Laterality Date   MYRINGOTOMY WITH TUBE PLACEMENT     NO PAST SURGERIES      Family Psychiatric History: Please see initial evaluation for full details. I have reviewed the history. No updates at this time.     Family History:  Family History  Problem Relation Age of Onset   Hyperlipidemia Father    Diabetes Father    ADD / ADHD Sister    Depression Sister    Eating disorder Sister    Pulmonary fibrosis Maternal Grandmother    Lung cancer Maternal Grandfather    Dementia Maternal Grandfather    Bladder Cancer Paternal Grandmother    Heart disease Paternal Grandfather    Heart attack Paternal Grandfather    Healthy Mother     Social History:  Social History   Socioeconomic History   Marital status: Single    Spouse name: Not on file   Number of children: 0   Years of education: Not on file   Highest education level: Not on file  Occupational History   Occupation: Management consultant  Tobacco Use   Smoking status: Never   Smokeless tobacco: Never  Vaping Use   Vaping Use: Never used  Substance and Sexual Activity   Alcohol use: Yes    Alcohol/week: 3.0 standard drinks of alcohol    Types: 3 Cans of beer per week   Drug use: Never   Sexual activity: Yes    Birth control/protection: Pill  Other Topics Concern   Not on file  Social History Narrative   In college - Production designer, theatre/television/film regularly   Social Determinants of Health    Financial Resource Strain: Not on file  Food Insecurity: Not on file  Transportation Needs: Not on file  Physical Activity: Not on file  Stress: Not on file  Social Connections: Not on file    Allergies: No Known Allergies  Metabolic Disorder Labs: No results found for: "HGBA1C", "MPG" No results found for: "PROLACTIN" Lab Results  Component Value Date   CHOL 169 01/26/2014   TRIG 73.0 01/26/2014   HDL 62.00 01/26/2014   CHOLHDL 3 01/26/2014   VLDL 14.6 01/26/2014   LDLCALC 92 01/26/2014   Lab Results  Component Value Date   TSH 2.02 01/26/2014    Therapeutic Level Labs: No results found for: "LITHIUM" No results found for: "VALPROATE" No results found for: "CBMZ"  Current Medications: Current Outpatient Medications  Medication Sig Dispense Refill   escitalopram (LEXAPRO) 10 MG tablet Take 1.5 tablets (15 mg total) by mouth daily. 135 tablet 0   ipratropium (ATROVENT) 0.03 % nasal spray Place 2 sprays into both nostrils every 12 (twelve) hours. 30 mL 0   levonorgestrel-ethinyl estradiol (ALESSE) 0.1-20 MG-MCG tablet TAKE 1 TABLET BY MOUTH ONCE DAILY 84 tablet 4   Multiple Vitamin (MULTIVITAMIN) tablet Take 1 tablet by mouth daily.     pantoprazole (PROTONIX) 40 MG tablet  Take 1 tablet (40 mg total) by mouth daily. 30 tablet 11   No current facility-administered medications for this visit.     Musculoskeletal: Strength & Muscle Tone:  N/A Gait & Station:  N/A Patient leans: N/A  Psychiatric Specialty Exam: Review of Systems  There were no vitals taken for this visit.There is no height or weight on file to calculate BMI.  General Appearance: {Appearance:22683}  Eye Contact:  {BHH EYE CONTACT:22684}  Speech:  Clear and Coherent  Volume:  Normal  Mood:  {BHH MOOD:22306}  Affect:  {Affect (PAA):22687}  Thought Process:  Coherent  Orientation:  Full (Time, Place, and Person)  Thought Content: Logical   Suicidal Thoughts:  {ST/HT (PAA):22692}  Homicidal  Thoughts:  {ST/HT (PAA):22692}  Memory:  Immediate;   Good  Judgement:  {Judgement (PAA):22694}  Insight:  {Insight (PAA):22695}  Psychomotor Activity:  Normal  Concentration:  Concentration: Good and Attention Span: Good  Recall:  Good  Fund of Knowledge: Good  Language: Good  Akathisia:  No  Handed:  Right  AIMS (if indicated): not done  Assets:  Communication Skills Desire for Improvement  ADL's:  Intact  Cognition: WNL  Sleep:  {BHH GOOD/FAIR/POOR:22877}   Screenings: [employer]    Flowsheet Row Office Visit from 11/13/2021 in [facility] Video Visit from 06/09/2021 in [facility] Video Visit from 04/11/2021 in [facility] Office Visit from 12/07/2020 in [facility] Nutrition from 04/01/2014 in [facility]  PHQ-2 Total Score 2 1 0 3 0  PHQ-9 Total Score 7 -- -- 9 --      Flowsheet Row Office Visit from 11/13/2021 in [facility] Video Visit from 06/09/2021 in [facility] Video Visit from 04/11/2021 in [facility]  C-SSRS RISK CATEGORY No Risk No Risk No Risk        Assessment and Plan:  Sandra Dawson is a 26 y.o. year old female with a history of OCD, anxiety, history of ADHD, who presents for follow up appointment for below.   1. Mixed obsessional thoughts and acts 2. Major depressive disorder with single episode, in partial remission (HCC) 3. Anxiety state There has been overall improvement in OCD, depression and anxiety since she started to have a dog/social connection with others. Other psychosocial stressors includes recent relocation, loss of her grandfather.  She has good support from her family.  Will uptitrate Lexapro to optimize treatment for OCD, depression and anxiety.    # hirsutism # Acne She reports above symptoms since starting  Lexapro.  She was encouraged to establish care with her PCP to rule out PCOS/any other medical condition contributing to this.  She was advised to contact the office if any worsening in symptoms after up titration of Lexapro.    # Alcohol use She reports 1 episode of binge alcohol use in the context of being stressed.  She is motivated for sobriety.  Will continue motivational interview.    # history of ADHD Unchanged. She sees [place] and attentional specialist.      Plan Increase lexapro 15 mg daily - monitor hirsutism, acne She was advised to establish care with PCP to obtain labs (TSH, Hb) and evaluation of hirsutism/acne Next appointment-  6/23 at 8 AM for 30 mins, video - on Vyvanse 20 mg daily    Past trials of medication: sertraline,    The patient demonstrates the following risk factors for suicide: Chronic risk  factors for suicide include: psychiatric disorder of anxiety. Acute risk factors for suicide include: N/A. Protective factors for this patient include: positive social support, responsibility to others (children, family), coping skills and hope for the future. Considering these factors, the overall suicide risk at this point appears to be low. Patient is appropriate for outpatient follow up.           Collaboration of Care: Collaboration of Care: {BH OP Collaboration of Care:21014065}  Patient/Guardian was advised Release of Information must be obtained prior to any record release in order to collaborate their care with an outside provider. Patient/Guardian was advised if they have not already done so to contact the registration department to sign all necessary forms in order for [place] to release information regarding their care.   Consent: Patient/Guardian gives verbal consent for treatment and assignment of benefits for services provided during this visit. Patient/Guardian expressed understanding and agreed to proceed.    Neysa Hotter, MD 02/05/2022, 10:51 AM

## 2022-02-09 ENCOUNTER — Telehealth (INDEPENDENT_AMBULATORY_CARE_PROVIDER_SITE_OTHER): Payer: BC Managed Care – PPO | Admitting: Psychiatry

## 2022-02-09 ENCOUNTER — Encounter: Payer: Self-pay | Admitting: Psychiatry

## 2022-02-09 DIAGNOSIS — R632 Polyphagia: Secondary | ICD-10-CM | POA: Diagnosis not present

## 2022-02-09 DIAGNOSIS — F324 Major depressive disorder, single episode, in partial remission: Secondary | ICD-10-CM | POA: Diagnosis not present

## 2022-02-09 DIAGNOSIS — F411 Generalized anxiety disorder: Secondary | ICD-10-CM

## 2022-02-09 DIAGNOSIS — F422 Mixed obsessional thoughts and acts: Secondary | ICD-10-CM | POA: Diagnosis not present

## 2022-02-09 MED ORDER — ESCITALOPRAM OXALATE 10 MG PO TABS: 15.0000 mg | ORAL_TABLET | Freq: Every day | ORAL | 0 refills | Status: DC

## 2022-02-16 ENCOUNTER — Telehealth: Payer: Self-pay | Admitting: Gastroenterology

## 2022-02-16 ENCOUNTER — Other Ambulatory Visit: Payer: Self-pay | Admitting: Gastroenterology

## 2022-02-16 MED ORDER — PANTOPRAZOLE SODIUM 40 MG PO TBEC: 40.0000 mg | DELAYED_RELEASE_TABLET | Freq: Every day | ORAL | 0 refills | Status: DC

## 2022-02-16 NOTE — Telephone Encounter (Signed)
Patient called requesting a refill of her pantoprazole.

## 2022-02-16 NOTE — Telephone Encounter (Signed)
Informed patient she is due for a follow up visit but I can send one refill to her pharmacy until scheduled appt. Patient states she has moved to [facility] and Dr. Russella Dar gave recommendations to a new GI doc in [place] but patient forgot the names of the physicians. Gave patient recommendations again. Per last office note: Some recommendations of Gastrointestinal physicians in [place] are: Janine Ores, MD Alger Simons, MD  Sent one refill to patient's pharmacy in [place] until told patient to call if she needs one more fill until she gets established with a new doctor. Patient agreed and verbalized understanding.

## 2022-03-16 ENCOUNTER — Other Ambulatory Visit: Payer: Self-pay | Admitting: Gastroenterology

## 2022-04-12 NOTE — Progress Notes (Signed)
Virtual Visit via Video Note  I connected with Sandra Dawson on 04/13/22 at 11:30 AM EDT by a video enabled telemedicine application and verified that I am speaking with the correct person using two identifiers.  Location: Patient: home Provider: office Persons participated in the visit- patient, provider    I discussed the limitations of evaluation and management by telemedicine and the availability of in person appointments. The patient expressed understanding and agreed to proceed.   I discussed the assessment and treatment plan with the patient. The patient was provided an opportunity to ask questions and all were answered. The patient agreed with the plan and demonstrated an understanding of the instructions.   The patient was advised to call back or seek an in-person evaluation if the symptoms worsen or if the condition fails to improve as anticipated.  I provided 16 minutes of non-face-to-face time during this encounter.   Sandra Hotter, MD   [facility] MD/PA/NP OP Progress Note  04/13/2022 12:07 PM Sandra Dawson  MRN:  [id number]  Chief Complaint:  Chief Complaint  Patient presents with   Follow-up   HPI:  This is a follow-up appointment for OCD, depression and anxiety.  She states that she has been doing better except that she has some cold.  She has difficulty in getting things done, which she attributes to ADHD.  However, she has been able to get things organized.  She enjoys crafting.  She does not have much rituals anymore.  She takes shower 12-15 mins, and she uses soap.  She denies obsessions.  She reports good relationship with her boyfriend.  Although she feels down when she is unable to see him, she denies significant concern otherwise.  She denies binge eating.  She is trying to take a walk with her friend.  She sleeps well.  She denies SI.  She denies panic attacks. She denies irritability. She drinks 2 glasses of wine at times.  She denies any drug use.  She  feels comfortable to stay on Lexapro.  She thinks she has less acne lately. She is planning to have an appointment with her PCP.    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 Mixed obsessional thoughts and acts  F42.2     2. Major depressive disorder with single episode, in partial remission (HCC)  F32.4     3. Anxiety state  F41.1       Past Psychiatric History: Please see initial evaluation for full details. I have reviewed the history. No updates at this time.     Past Medical History:  Past Medical History:  Diagnosis Date   Acid reflux    ADHD (attention deficit hyperactivity disorder)    History of chicken pox     Past Surgical History:  Procedure Laterality Date   MYRINGOTOMY WITH TUBE PLACEMENT     NO PAST SURGERIES      Family Psychiatric History: Please see initial evaluation for full details. I have reviewed the history. No updates at this time.     Family History:  Family History  Problem Relation Age of Onset   Hyperlipidemia Father    Diabetes Father  ADD / ADHD Sister    Depression Sister    Eating disorder Sister    Pulmonary fibrosis Maternal Grandmother    Lung cancer Maternal Grandfather    Dementia Maternal Grandfather    Bladder Cancer Paternal Grandmother    Heart disease Paternal Grandfather    Heart attack Paternal Grandfather    Healthy Mother     Social History:  Social History   Socioeconomic History   Marital status: Single    Spouse name: Not on file   Number of children: 0   Years of education: Not on file   Highest education level: Not on file  Occupational History   Occupation:  Management consultant  Tobacco Use   Smoking status: Never   Smokeless tobacco: Never  Vaping Use   Vaping Use: Never used  Substance and Sexual Activity   Alcohol use: Yes    Alcohol/week: 3.0 standard drinks of alcohol    Types: 3 Cans of beer per week   Drug use: Never   Sexual activity: Yes    Birth control/protection: Pill  Other Topics Concern   Not on file  Social History Narrative   In college - Production designer, theatre/television/film regularly   Social Determinants of Health   Financial Resource Strain: Not on file  Food Insecurity: Not on file  Transportation Needs: Not on file  Physical Activity: Not on file  Stress: Not on file  Social Connections: Not on file    Allergies: No Known Allergies  Metabolic Disorder Labs: No results found for: "HGBA1C", "MPG" No results found for: "PROLACTIN" Lab Results  Component Value Date   CHOL 169 01/26/2014   TRIG 73.0 01/26/2014   HDL 62.00 01/26/2014   CHOLHDL 3 01/26/2014   VLDL 14.6 01/26/2014   LDLCALC 92 01/26/2014   Lab Results  Component Value Date   TSH 2.02 01/26/2014    Therapeutic Level Labs: No results found for: "LITHIUM" No results found for: "VALPROATE" No results found for: "CBMZ"  Current Medications: Current Outpatient Medications  Medication Sig Dispense Refill   [START ON 05/13/2022] escitalopram (LEXAPRO) 10 MG tablet Take 1.5 tablets (15 mg total) by mouth daily. 135 tablet 0   ipratropium (ATROVENT) 0.03 % nasal spray Place 2 sprays into both nostrils every 12 (twelve) hours. 30 mL 0   levonorgestrel-ethinyl estradiol (ALESSE) 0.1-20 MG-MCG tablet TAKE 1 TABLET BY MOUTH ONCE DAILY 84 tablet 4   Multiple Vitamin (MULTIVITAMIN) tablet Take 1 tablet by mouth daily.     pantoprazole (PROTONIX) 40 MG tablet TAKE 1 TABLET BY MOUTH EVERY DAY 30 tablet 0   No current facility-administered medications for this visit.     Musculoskeletal: Strength & Muscle Tone:  N/A Gait & Station:   N/A Patient leans: N/A  Psychiatric Specialty Exam: Review of Systems  Psychiatric/Behavioral:  Positive for dysphoric mood. Negative for agitation, behavioral problems, confusion, decreased concentration, hallucinations, self-injury, sleep disturbance and suicidal ideas. The patient is nervous/anxious. The patient is not hyperactive.   All other systems reviewed and are negative.   There were no vitals taken for this visit.There is no height or weight on file to calculate BMI.  General Appearance: Fairly Groomed  Eye Contact:  Good  Speech:  Clear and Coherent  Volume:  Normal  Mood:   good  Affect:  Appropriate, Congruent, and Full Range  Thought Process:  Coherent  Orientation:  Full (Time, Place, and Person)  Thought Content: Logical   Suicidal  Thoughts:  No  Homicidal Thoughts:  No  Memory:  Immediate;   Good  Judgement:  Good  Insight:  Good  Psychomotor Activity:  Normal  Concentration:  Concentration: Good and Attention Span: Good  Recall:  Good  Fund of Knowledge: Good  Language: Good  Akathisia:  No  Handed:  Right  AIMS (if indicated): not done  Assets:  Communication Skills Desire for Improvement  ADL's:  Intact  Cognition: WNL  Sleep:  Good   Screenings: PHQ2-9    Flowsheet Row Office Visit from 11/13/2021 in [facility] Video Visit from 06/09/2021 in [facility] Video Visit from 04/11/2021 in [facility] Office Visit from 12/07/2020 in [facility] Nutrition from 04/01/2014 in [facility]  PHQ-2 Total Score 2 1 0 3 0  PHQ-9 Total Score 7 -- -- 9 --      Flowsheet Row Office Visit from 11/13/2021 in [facility] Video Visit from 06/09/2021 in [facility] Video Visit from 04/11/2021 in [facility]  C-SSRS RISK CATEGORY No Risk No Risk No  Risk        Assessment and Plan:  Sandra Dawson is a 26 y.o. year old female with a history of  OCD, anxiety, history of ADHD, who presents for follow up appointment for below.   1. Mixed obsessional thoughts and acts 2. Major depressive disorder with single episode, in partial remission (HCC) 3. Anxiety state There has been overall improvement in OCD, anxiety, and she denies any significant depressive symptoms since uptitration of Lexapro.  She reports good relationship with her boyfriend, and her family. Psychosocial stressors includes loss of her grandfather.  Will continue current dose of Lexapro to target OCD, depression and anxiety.    # hirsutism # Acne Improving. She reports above symptoms since starting Lexapro.  She is in the process of establishing PCP care to rule out PCOS/any other medical condition contributing to this.    # Alcohol use Improving. She has a history of binge alcohol use in the context of stress.  She has cut down alcohol use, and is motivated for sobriety.  Discussed potential option of pharmacological treatment if any worsening.  Will continue motivational interview.    # Binge eating Improving.   She is willing to work on diet and exercise.  Discussed potential option of pharmacological treatment in the future.  May consider topiramate given she had a worsening in OCD at higher dose of Vyvanse.    # history of ADHD Unchanged. She sees [place] and attentional specialist.      Plan Continue lexapro 15 mg daily - monitor hirsutism, acne She was advised again to establish care with PCP to obtain labs (TSH, Hb) and evaluation of hirsutism/acne Next appointment-  10/20 at 11 AM for 30 mins, video - on Vyvanse 20 mg daily (worsening in OCD at higher dose)   Past trials of medication: sertraline,    The patient demonstrates the following risk factors for suicide: Chronic risk factors for suicide include: psychiatric disorder of anxiety. Acute risk  factors for suicide include: N/A. Protective factors for this patient include: positive social support, responsibility to others (children, family), coping skills and hope for the future. Considering these factors, the overall suicide risk at this point appears to be low. Patient is appropriate for outpatient follow up.             Collaboration of Care:  Collaboration of Care: Other N/A  Patient/Guardian was advised Release of Information must be obtained prior to any record release in order to collaborate their care with an outside provider. Patient/Guardian was advised if they have not already done so to contact the registration department to sign all necessary forms in order for [place] to release information regarding their care.   Consent: Patient/Guardian gives verbal consent for treatment and assignment of benefits for services provided during this visit. Patient/Guardian expressed understanding and agreed to proceed.    Sandra Hotter, MD 04/13/2022, 12:07 PM

## 2022-04-13 ENCOUNTER — Telehealth (INDEPENDENT_AMBULATORY_CARE_PROVIDER_SITE_OTHER): Payer: BC Managed Care – PPO | Admitting: Psychiatry

## 2022-04-13 ENCOUNTER — Encounter: Payer: Self-pay | Admitting: Psychiatry

## 2022-04-13 DIAGNOSIS — F324 Major depressive disorder, single episode, in partial remission: Secondary | ICD-10-CM | POA: Diagnosis not present

## 2022-04-13 DIAGNOSIS — F422 Mixed obsessional thoughts and acts: Secondary | ICD-10-CM | POA: Diagnosis not present

## 2022-04-13 DIAGNOSIS — F411 Generalized anxiety disorder: Secondary | ICD-10-CM

## 2022-04-13 MED ORDER — ESCITALOPRAM OXALATE 10 MG PO TABS: 15.0000 mg | ORAL_TABLET | Freq: Every day | ORAL | 0 refills | Status: DC

## 2022-04-13 NOTE — Patient Instructions (Signed)
Continue lexapro 15 mg daily  Next appointment-  10/20 at 11 AM

## 2022-06-07 NOTE — Progress Notes (Signed)
Virtual Visit via Video Note  I connected with Sandra Dawson on 06/08/22 at 11:00 AM EDT by a video enabled telemedicine application and verified that I am speaking with the correct person using two identifiers.  Location: Patient: home Provider: office Persons participated in the visit- patient, provider    I discussed the limitations of evaluation and management by telemedicine and the availability of in person appointments. The patient expressed understanding and agreed to proceed.    I discussed the assessment and treatment plan with the patient. The patient was provided an opportunity to ask questions and all were answered. The patient agreed with the plan and demonstrated an understanding of the instructions.   The patient was advised to call back or seek an in-person evaluation if the symptoms worsen or if the condition fails to improve as anticipated.  I provided 15 minutes of non-face-to-face time during this encounter.   Neysa Hotter, MD    [facility] MD/PA/NP OP Progress Note  06/08/2022 11:35 AM Sandra Dawson  MRN:  [id number]  Chief Complaint:  Chief Complaint  Patient presents with   Follow-up   HPI:  This is a follow-up appointment for OCD and anxiety.  She states that she was dumped on text last Sunday.  Although she was having a hard time, she is not doing better.  Her friends checked in with her frequently, and it has helped.  She and her friends were shocked at this.  She is trying to take it as it is.  She has been doing crafts, being prepared for Christmas gifts.  It helps her to focus on things.  She makes scarves and candles.  She has fair sleep.  She has been working on [employer].  Although she has difficulty in concentration especially when she is stressed, it has been manageable.  She denies feeling depressed.  She denies SI.  She denies panic attacks.  It takes 15 minutes to take a shower.  She reports less obsessions or compulsions.  She drinks a  couple of drinks a week.  She denies drug use.  She thinks the current dose of Lexapro has been working well, and would like to stay the same dose.    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 Mixed obsessional thoughts and acts  F42.2     2. Major depressive disorder with single episode, in partial remission (HCC)  F32.4     3. Anxiety state  F41.1       Past Psychiatric History: Please see initial evaluation for full details. I have reviewed the history. No updates at this time.     Past Medical History:  Past Medical History:  Diagnosis Date   Acid reflux    ADHD (attention deficit hyperactivity disorder)    History of chicken pox     Past Surgical History:  Procedure Laterality Date   MYRINGOTOMY WITH TUBE PLACEMENT     NO PAST SURGERIES      Family Psychiatric History: Please see initial evaluation for full details. I have reviewed the history. No updates at this time.     Family History:  Family History  Problem Relation Age  of Onset   Hyperlipidemia Father    Diabetes Father    ADD / ADHD Sister    Depression Sister    Eating disorder Sister    Pulmonary fibrosis Maternal Grandmother    Lung cancer Maternal Grandfather    Dementia Maternal Grandfather    Bladder Cancer Paternal Grandmother    Heart disease Paternal Grandfather    Heart attack Paternal Grandfather    Healthy Mother     Social History:  Social History   Socioeconomic History   Marital status: Single    Spouse name: Not on file   Number of children: 0   Years of education: Not on file   Highest education level: Not on file  Occupational History    Occupation: Information systems manager  Tobacco Use   Smoking status: Never   Smokeless tobacco: Never  Vaping Use   Vaping Use: Never used  Substance and Sexual Activity   Alcohol use: Yes    Alcohol/week: 3.0 standard drinks of alcohol    Types: 3 Cans of beer per week   Drug use: Never   Sexual activity: Yes    Birth control/protection: Pill  Other Topics Concern   Not on file  Social History Narrative   In college - Dance movement psychotherapist regularly   Social Determinants of Health   Financial Resource Strain: Not on file  Food Insecurity: Not on file  Transportation Needs: Not on file  Physical Activity: Not on file  Stress: Not on file  Social Connections: Not on file    Allergies: No Known Allergies  Metabolic Disorder Labs: No results found for: "HGBA1C", "MPG" No results found for: "PROLACTIN" Lab Results  Component Value Date   CHOL 169 01/26/2014   TRIG 73.0 01/26/2014   HDL 62.00 01/26/2014   CHOLHDL 3 01/26/2014   VLDL 14.6 01/26/2014   LDLCALC 92 01/26/2014   Lab Results  Component Value Date   TSH 2.02 01/26/2014    Therapeutic Level Labs: No results found for: "LITHIUM" No results found for: "VALPROATE" No results found for: "CBMZ"  Current Medications: Current Outpatient Medications  Medication Sig Dispense Refill   [START ON 08/12/2022] escitalopram (LEXAPRO) 10 MG tablet Take 1.5 tablets (15 mg total) by mouth daily. 135 tablet 0   ipratropium (ATROVENT) 0.03 % nasal spray Place 2 sprays into both nostrils every 12 (twelve) hours. 30 mL 0   levonorgestrel-ethinyl estradiol (ALESSE) 0.1-20 MG-MCG tablet TAKE 1 TABLET BY MOUTH ONCE DAILY 84 tablet 4   Multiple Vitamin (MULTIVITAMIN) tablet Take 1 tablet by mouth daily.     pantoprazole (PROTONIX) 40 MG tablet TAKE 1 TABLET BY MOUTH EVERY DAY 30 tablet 0   No current facility-administered medications for this visit.     Musculoskeletal: Strength & Muscle Tone:   N/A Gait & Station:  N/A Patient leans: N/A  Psychiatric Specialty Exam: Review of Systems  Psychiatric/Behavioral:  Negative for agitation, behavioral problems, confusion, decreased concentration, dysphoric mood, hallucinations, self-injury, sleep disturbance and suicidal ideas. The patient is nervous/anxious. The patient is not hyperactive.   All other systems reviewed and are negative.   There were no vitals taken for this visit.There is no height or weight on file to calculate BMI.  General Appearance: Fairly Groomed  Eye Contact:  Good  Speech:  Clear and Coherent  Volume:  Normal  Mood:   good  Affect:  Appropriate, Congruent, and calm  Thought Process:  Coherent  Orientation:  Full (  Time, Place, and Person)  Thought Content: Logical   Suicidal Thoughts:  No  Homicidal Thoughts:  No  Memory:  Immediate;   Good  Judgement:  Good  Insight:  Good  Psychomotor Activity:  Normal  Concentration:  Concentration: Good and Attention Span: Good  Recall:  Good  Fund of Knowledge: Good  Language: Good  Akathisia:  No  Handed:  Right  AIMS (if indicated): not done  Assets:  Communication Skills Desire for Improvement  ADL's:  Intact  Cognition: WNL  Sleep:  Good   Screenings: PHQ2-9    Flowsheet Row Office Visit from 11/13/2021 in [facility] Video Visit from 06/09/2021 in [facility] Video Visit from 04/11/2021 in [facility] Office Visit from 12/07/2020 in [facility] Nutrition [facility]/01/2014 [facility]  PHQ-2 Total Score 2 1 0 3 0  PHQ-9 Total Score 7 -- -- 9 --      Flowsheet Row Office Visit from 11/13/2021 in [facility] Video Visit from 06/09/2021 in [facility] Video Visit from 04/11/2021 in [facility]  C-SSRS RISK CATEGORY No Risk No Risk  No Risk        Assessment and Plan:  Sandra Dawson is a 26 y.o. year old female with a history of OCD, anxiety, history of ADHD, who presents for follow up appointment for below.    1. Mixed obsessional thoughts and acts 2. Major depressive disorder with single episode, in partial remission (HCC) 3. Anxiety state There has been steady improvement in OCD, depression and anxiety despite the recent break-up. She reports good relationship with her boyfriend, and her family. Psychosocial stressors includes loss of her grandfather.  Will continue current dose of Lexapro to target OCD, depression and anxiety.   # hirsutism # Acne Improving. She reports above symptoms since starting Lexapro.  She is in the process of establishing PCP care to rule out PCOS/any other medical condition contributing to this.    # Alcohol use Improving.  She has a history of binge alcohol use in the context of stress.  She has cut down alcohol use, and is motivated for sobriety.  Discussed potential option of pharmacological treatment if any worsening.  Will continue motivational interview.    # Binge eating Improving.   She is willing to work on diet and exercise.  Discussed potential option of pharmacological treatment in the future.  May consider topiramate given she had a worsening in OCD at higher dose of Vyvanse.    # history of ADHD Unchanged. She sees [place] and attentional specialist.      Plan Continue lexapro 15 mg daily - monitor hirsutism, acne She was advised again to establish care with PCP to obtain labs (TSH, Hb) and evaluation of hirsutism/acne Next appointment- 1/12 at 11 AM for 30 mins, video - on Vyvanse 20 mg daily (worsening in OCD at higher dose)   Past trials of medication: sertraline,    The patient demonstrates the following risk factors for suicide: Chronic risk factors for suicide include: psychiatric disorder of anxiety. Acute risk factors for suicide include: N/A. Protective  factors for this patient include: positive social support, responsibility to others (children, family), coping skills and hope for the future. Considering these factors, the overall suicide risk at this point appears to be low. Patient is appropriate for outpatient follow up.  Collaboration of Care: Collaboration of Care: Other N/A  Patient/Guardian was advised Release of Information must be obtained prior to any record release in order to collaborate their care with an outside provider. Patient/Guardian was advised if they have not already done so to contact the registration department to sign all necessary forms in order for [place] to release information regarding their care.   Consent: Patient/Guardian gives verbal consent for treatment and assignment of benefits for services provided during this visit. Patient/Guardian expressed understanding and agreed to proceed.    Neysa Hotter, MD 06/08/2022, 11:35 AM

## 2022-06-08 ENCOUNTER — Encounter: Payer: Self-pay | Admitting: Psychiatry

## 2022-06-08 ENCOUNTER — Telehealth (INDEPENDENT_AMBULATORY_CARE_PROVIDER_SITE_OTHER): Payer: BC Managed Care – PPO | Admitting: Psychiatry

## 2022-06-08 DIAGNOSIS — F422 Mixed obsessional thoughts and acts: Secondary | ICD-10-CM

## 2022-06-08 DIAGNOSIS — F324 Major depressive disorder, single episode, in partial remission: Secondary | ICD-10-CM | POA: Diagnosis not present

## 2022-06-08 DIAGNOSIS — F411 Generalized anxiety disorder: Secondary | ICD-10-CM | POA: Diagnosis not present

## 2022-06-08 MED ORDER — ESCITALOPRAM OXALATE 10 MG PO TABS: 15.0000 mg | ORAL_TABLET | Freq: Every day | ORAL | 0 refills | Status: DC

## 2022-06-08 NOTE — Patient Instructions (Signed)
Continue lexapro 15 mg daily - monitor hirsutism, acne Please establish care with PCP to obtain labs (TSH, Hb) and evaluation of hirsutism/acne Next appointment- 1/12 at 11 AM

## 2022-08-30 NOTE — Progress Notes (Signed)
Virtual Visit via Video Note  I connected with Sandra Dawson on 08/31/22 at 11:00 AM EST by a video enabled telemedicine application and verified that I am speaking with the correct person using two identifiers.  Location: Patient: home Provider: office Persons participated in the visit- patient, provider    I discussed the limitations of evaluation and management by telemedicine and the availability of in person appointments. The patient expressed understanding and agreed to proceed.     I discussed the assessment and treatment plan with the patient. The patient was provided an opportunity to ask questions and all were answered. The patient agreed with the plan and demonstrated an understanding of the instructions.   The patient was advised to call back or seek an in-person evaluation if the symptoms worsen or if the condition fails to improve as anticipated.  I provided 17 minutes of non-face-to-face time during this encounter.   Norman Clay, MD    [facility] MD/PA/NP OP Progress Note  08/31/2022 11:38 AM Sandra Dawson  MRN:  [id number]  Chief Complaint:  Chief Complaint  Patient presents with   Follow-up   HPI:  This is a follow-up appointment for depression and OCD.  She states that she is not doing well.  Her great uncle died, she had broke up, and she had friends who were having medical diagnosis.  It all happened in October, all at once. It has been overwhelming.  She also feels strained from parties during the holiday season. She is hoping to change her job this year.  She feels that people do not care about the employee.  She is currently on probation.  She has been doing craft, and spending time with her friends.  Her friend has been very supportive.  She has middle insomnia.  She denies change in appetite.  She denies SI.  She feels that her OCD symptoms has been much better.  She takes showers up to 30 minutes, and denies any concern.  She denies alcohol use or drug  use. Her family commented that her acne has been much better lately.  She is willing to try higher dose of Lexapro at this time.    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 Current mild episode of major depressive disorder without prior episode (HCC)  F32.0 CBC    TSH    2. Mixed obsessional thoughts and acts  F42.2     3. Anxiety state  F41.1       Past Psychiatric History: Please see initial evaluation for full details. I have reviewed the history. No updates at this time.     Past Medical History:  Past Medical History:  Diagnosis Date   Acid reflux    ADHD (attention deficit hyperactivity disorder)    History of chicken pox     Past Surgical History:  Procedure Laterality Date   MYRINGOTOMY WITH TUBE PLACEMENT     NO PAST SURGERIES      Family Psychiatric History: Please see initial evaluation for full details. I have reviewed the history. No updates at this time.     Family History:  Family History  Problem Relation Age of Onset  Hyperlipidemia Father    Diabetes Father    ADD / ADHD Sister    Depression Sister    Eating disorder Sister    Pulmonary fibrosis Maternal Grandmother    Lung cancer Maternal Grandfather    Dementia Maternal Grandfather    Bladder Cancer Paternal Grandmother    Heart disease Paternal Grandfather    Heart attack Paternal Grandfather    Healthy Mother     Social History:  Social History   Socioeconomic History   Marital status: Single    Spouse name: Not on file   Number of children: 0   Years of education: Not on file   Highest education level: Not on file  Occupational History    Occupation: Information systems manager  Tobacco Use   Smoking status: Never   Smokeless tobacco: Never  Vaping Use   Vaping Use: Never used  Substance and Sexual Activity   Alcohol use: Yes    Alcohol/week: 3.0 standard drinks of alcohol    Types: 3 Cans of beer per week   Drug use: Never   Sexual activity: Yes    Birth control/protection: Pill  Other Topics Concern   Not on file  Social History Narrative   In college - Dance movement psychotherapist regularly   Social Determinants of Health   Financial Resource Strain: Not on file  Food Insecurity: Not on file  Transportation Needs: Not on file  Physical Activity: Not on file  Stress: Not on file  Social Connections: Not on file    Allergies: No Known Allergies  Metabolic Disorder Labs: No results found for: "HGBA1C", "MPG" No results found for: "PROLACTIN" Lab Results  Component Value Date   CHOL 169 01/26/2014   TRIG 73.0 01/26/2014   HDL 62.00 01/26/2014   CHOLHDL 3 01/26/2014   VLDL 14.6 01/26/2014   LDLCALC 92 01/26/2014   Lab Results  Component Value Date   TSH 2.02 01/26/2014    Therapeutic Level Labs: No results found for: "LITHIUM" No results found for: "VALPROATE" No results found for: "CBMZ"  Current Medications: Current Outpatient Medications  Medication Sig Dispense Refill   escitalopram (LEXAPRO) 10 MG tablet Take 1.5 tablets (15 mg total) by mouth daily. 135 tablet 0   ipratropium (ATROVENT) 0.03 % nasal spray Place 2 sprays into both nostrils every 12 (twelve) hours. 30 mL 0   levonorgestrel-ethinyl estradiol (ALESSE) 0.1-20 MG-MCG tablet TAKE 1 TABLET BY MOUTH ONCE DAILY 84 tablet 4   Multiple Vitamin (MULTIVITAMIN) tablet Take 1 tablet by mouth daily.     pantoprazole (PROTONIX) 40 MG tablet TAKE 1 TABLET BY MOUTH EVERY DAY 30 tablet 0   No current facility-administered medications for this visit.     Musculoskeletal: Strength & Muscle Tone:  N/A Gait & Station:   N/A Patient leans: N/A  Psychiatric Specialty Exam: Review of Systems  Psychiatric/Behavioral:  Positive for dysphoric mood and sleep disturbance. Negative for agitation, behavioral problems, confusion, decreased concentration, hallucinations, self-injury and suicidal ideas. The patient is not nervous/anxious and is not hyperactive.   All other systems reviewed and are negative.   There were no vitals taken for this visit.There is no height or weight on file to calculate BMI.  General Appearance: Fairly Groomed  Eye Contact:  Good  Speech:  Clear and Coherent  Volume:  Normal  Mood:   not good  Affect:  Appropriate, Congruent, and slightly down  Thought Process:  Coherent  Orientation:  Full (Time, Place,  and Person)  Thought Content: Logical   Suicidal Thoughts:  No  Homicidal Thoughts:  No  Memory:  Immediate;   Good  Judgement:  Good  Insight:  Good  Psychomotor Activity:  Normal  Concentration:  Concentration: Good and Attention Span: Good  Recall:  Good  Fund of Knowledge: Good  Language: Good  Akathisia:  No  Handed:  Right  AIMS (if indicated): not done  Assets:  Communication Skills Desire for Improvement  ADL's:  Intact  Cognition: WNL  Sleep:  Fair   Screenings: PHQ2-9    Flowsheet Row Office Visit from 11/13/2021 in [facility] Video Visit from 06/09/2021 in [facility] Video Visit from 04/11/2021 in [facility] Office Visit from 12/07/2020 in [facility] Nutrition from 04/01/2014 in [facility]  PHQ-2 Total Score 2 1 0 3 0  PHQ-9 Total Score 7 -- -- 9 --      Flowsheet Row Office Visit from 11/13/2021 in [facility] Video Visit from 06/09/2021 in [facility] Video Visit from 04/11/2021 in [facility]  C-SSRS RISK CATEGORY No Risk No  Risk No Risk        Assessment and Plan:  Sandra Dawson is a 27 y.o. year old female with a history of OCD, anxiety, history of ADHD, who presents for follow up appointment for below.   1. Current mild episode of major depressive disorder without prior episode (HCC) 2. Mixed obsessional thoughts and acts 3. Anxiety state There has been slight worsening in depressive symptoms over the past few months.  Recent psychosocial stressors includes break-up, loss of her great uncle, and work-related stress.  She reports good support from her friends and her family.  Will uptitrate Lexapro to optimize treatment for depression, anxiety and OCD.  Discussed potential risk of serotonin syndrome with concomitant use of Vyvanse. Will obtain labs to rule out medical health issues contributing to this.   # hirsutism # Acne Improving. She reports above symptoms since starting Lexapro.  She is in the process of establishing PCP care to rule out PCOS/any other medical condition contributing to this.    # Alcohol use Improving.  She has a history of binge alcohol use in the context of stress.  She has cut down alcohol use, and is motivated for sobriety.  Discussed potential option of pharmacological treatment if any worsening.  Will continue motivational interview.    # Binge eating Improving.   She is willing to work on diet and exercise.  Discussed potential option of pharmacological treatment in the future.  May consider topiramate given she had a worsening in OCD at higher dose of Vyvanse.    # history of ADHD Unchanged. She sees [place] and attentional specialist.      Plan Increase lexapro 20 mg daily - monitor hirsutism, acne Obtain labs (CBC, TSH) She was advised again to establish care with PCP for evaluation of hirsutism/acne Next appointment- 2/16 at 10 am for 30 mins, video - on Vyvanse 20 mg daily (worsening in OCD at higher dose)   Past trials of medication: sertraline,    The  patient demonstrates the following risk factors for suicide: Chronic risk factors for suicide include: psychiatric disorder of anxiety. Acute risk factors for suicide include: N/A. Protective factors for this patient include: positive social support, responsibility to others (children, family), coping skills and hope for the future. Considering these factors, the overall  suicide risk at this point appears to be low. Patient is appropriate for outpatient follow up.            Collaboration of Care: Collaboration of Care: Other reviewed notes in Epic  Patient/Guardian was advised Release of Information must be obtained prior to any record release in order to collaborate their care with an outside provider. Patient/Guardian was advised if they have not already done so to contact the registration department to sign all necessary forms in order for [place] to release information regarding their care.   Consent: Patient/Guardian gives verbal consent for treatment and assignment of benefits for services provided during this visit. Patient/Guardian expressed understanding and agreed to proceed.    Norman Clay, MD 08/31/2022, 11:38 AM

## 2022-08-31 ENCOUNTER — Encounter: Payer: Self-pay | Admitting: Psychiatry

## 2022-08-31 ENCOUNTER — Telehealth (INDEPENDENT_AMBULATORY_CARE_PROVIDER_SITE_OTHER): Payer: BC Managed Care – PPO | Admitting: Psychiatry

## 2022-08-31 DIAGNOSIS — F422 Mixed obsessional thoughts and acts: Secondary | ICD-10-CM

## 2022-08-31 DIAGNOSIS — F411 Generalized anxiety disorder: Secondary | ICD-10-CM

## 2022-08-31 DIAGNOSIS — F320 Major depressive disorder, single episode, mild: Secondary | ICD-10-CM | POA: Diagnosis not present

## 2022-08-31 NOTE — Patient Instructions (Signed)
Increase lexapro 20 mg daily  Obtain labs (CBC, TSH) Please consider establishing the care with primary care provider Next appointment- 2/16 at 10 am

## 2022-09-11 ENCOUNTER — Other Ambulatory Visit (HOSPITAL_COMMUNITY): Payer: Self-pay

## 2022-09-11 MED ORDER — LISDEXAMFETAMINE DIMESYLATE 20 MG PO CAPS
20.0000 mg | ORAL_CAPSULE | Freq: Every day | ORAL | 0 refills | Status: DC
  Filled 2022-09-11: qty 30, 30d supply, fill #0

## 2022-10-03 NOTE — Progress Notes (Signed)
Virtual Visit via Video Note  I connected with Sandra Dawson on 10/05/22 at 10:00 AM EST by a video enabled telemedicine application and verified that I am speaking with the correct person using two identifiers.  Location: Patient: home Provider: office Persons participated in the visit- patient, provider    I discussed the limitations of evaluation and management by telemedicine and the availability of in person appointments. The patient expressed understanding and agreed to proceed.     I discussed the assessment and treatment plan with the patient. The patient was provided an opportunity to ask questions and all were answered. The patient agreed with the plan and demonstrated an understanding of the instructions.   The patient was advised to call back or seek an in-person evaluation if the symptoms worsen or if the condition fails to improve as anticipated.  I provided 12 minutes of non-face-to-face time during this encounter.   Norman Clay, MD    [facility] MD/PA/NP OP Progress Note  10/05/2022 10:28 AM Sandra Dawson  MRN:  [id number]  Chief Complaint:  Chief Complaint  Patient presents with   Follow-up   HPI:  This is a follow-up appointment for OCD, depression and anxiety.  She states that she has been doing well.  Although she had to write ups at work, they are now proud that she is doing better. She is considering finding another work.  She was able to take care of 28-year-old baby of her friend.  She could not do it a few years ago.  Taking a shower is getting much better.  She can do it in 15 minutes, and is using a less soap.  She is not doing binge eating compared to before.  Although she feels anxious, she tries to explore why she feels that way.  It is the same with OCD when she tends to wash hands more repeatedly.  She has middle insomnia.  She denies feeling depressed.  She denies SI.  She denies panic attacks.  She drinks up to a few drinks a few times per week.   She denies drug use.  She feels comfortable to stay on the current medication regimen.    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 Mixed obsessional thoughts and acts  F42.2     2. Major depressive disorder with single episode, in partial remission ([place])  F32.4     3. Anxiety state  F41.1       Past Psychiatric History: Please see initial evaluation for full details. I have reviewed the history. No updates at this time.     Past Medical History:  Past Medical History:  Diagnosis Date   Acid reflux    ADHD (attention deficit hyperactivity disorder)    History of chicken pox     Past Surgical History:  Procedure Laterality Date   MYRINGOTOMY WITH TUBE PLACEMENT     NO PAST SURGERIES      Family Psychiatric History: Please see initial evaluation for full details. I have reviewed the history. No updates at this time.     Family History:  Family History  Problem Relation Age of Onset   Hyperlipidemia Father  Diabetes Father    ADD / ADHD Sister    Depression Sister    Eating disorder Sister    Pulmonary fibrosis Maternal Grandmother    Lung cancer Maternal Grandfather    Dementia Maternal Grandfather    Bladder Cancer Paternal Grandmother    Heart disease Paternal Grandfather    Heart attack Paternal Grandfather    Healthy Mother     Social History:  Social History   Socioeconomic History   Marital status: Single    Spouse name: Not on file   Number of children: 0   Years of education: Not on file   Highest education level: Not on file  Occupational History   Occupation: Information systems manager  Tobacco Use    Smoking status: Never   Smokeless tobacco: Never  Vaping Use   Vaping Use: Never used  Substance and Sexual Activity   Alcohol use: Yes    Alcohol/week: 3.0 standard drinks of alcohol    Types: 3 Cans of beer per week   Drug use: Never   Sexual activity: Yes    Birth control/protection: Pill  Other Topics Concern   Not on file  Social History Narrative   In college - Dance movement psychotherapist regularly   Social Determinants of Health   Financial Resource Strain: Not on file  Food Insecurity: Not on file  Transportation Needs: Not on file  Physical Activity: Not on file  Stress: Not on file  Social Connections: Not on file    Allergies: No Known Allergies  Metabolic Disorder Labs: No results found for: "HGBA1C", "MPG" No results found for: "PROLACTIN" Lab Results  Component Value Date   CHOL 169 01/26/2014   TRIG 73.0 01/26/2014   HDL 62.00 01/26/2014   CHOLHDL 3 01/26/2014   VLDL 14.6 01/26/2014   LDLCALC 92 01/26/2014   Lab Results  Component Value Date   TSH 2.02 01/26/2014    Therapeutic Level Labs: No results found for: "LITHIUM" No results found for: "VALPROATE" No results found for: "CBMZ"  Current Medications: Current Outpatient Medications  Medication Sig Dispense Refill   [START ON 11/10/2022] escitalopram (LEXAPRO) 10 MG tablet Take 1.5 tablets (15 mg total) by mouth daily. 135 tablet 0   ipratropium (ATROVENT) 0.03 % nasal spray Place 2 sprays into both nostrils every 12 (twelve) hours. 30 mL 0   levonorgestrel-ethinyl estradiol (ALESSE) 0.1-20 MG-MCG tablet TAKE 1 TABLET BY MOUTH ONCE DAILY 84 tablet 4   lisdexamfetamine (VYVANSE) 20 MG capsule Take 1 capsule (20 mg total) by mouth daily. 30 capsule 0   Multiple Vitamin (MULTIVITAMIN) tablet Take 1 tablet by mouth daily.     pantoprazole (PROTONIX) 40 MG tablet TAKE 1 TABLET BY MOUTH EVERY DAY 30 tablet 0   No current facility-administered medications for this visit.      Musculoskeletal: Strength & Muscle Tone:  N/A Gait & Station:  N/A Patient leans: N/A  Psychiatric Specialty Exam: Review of Systems  Psychiatric/Behavioral:  Positive for sleep disturbance. Negative for agitation, behavioral problems, confusion, decreased concentration, dysphoric mood, hallucinations, self-injury and suicidal ideas. The patient is nervous/anxious. The patient is not hyperactive.   All other systems reviewed and are negative.   There were no vitals taken for this visit.There is no height or weight on file to calculate BMI.  General Appearance: Casual and Fairly Groomed  Eye Contact:  Good  Speech:  Clear and Coherent  Volume:  Normal  Mood:   good  Affect:  Appropriate, Congruent, and Full Range  Thought Process:  Coherent  Orientation:  Full (Time, Place, and Person)  Thought Content: Logical   Suicidal Thoughts:  No  Homicidal Thoughts:  No  Memory:  Immediate;   Good  Judgement:  Good  Insight:  Good  Psychomotor Activity:  Normal  Concentration:  Concentration: Good and Attention Span: Good  Recall:  Good  Fund of Knowledge: Good  Language: Good  Akathisia:  No  Handed:  Right  AIMS (if indicated): not done  Assets:  Communication Skills Desire for Improvement  ADL's:  Intact  Cognition: WNL  Sleep:  Fair   Screenings: IT sales professional Office Visit from 11/13/2021 in [place] Video Visit from 06/09/2021 in [place] Video Visit from 04/11/2021 in [place] Office Visit from 12/07/2020 in [place] from 04/01/2014 in [place] at [facility] Total Score 2 1 0 3 0  PHQ-9 Total Score 7 -- -- 9 --      [place] Office Visit from 11/13/2021 in [place] Video Visit from  06/09/2021 in [place] Video Visit from 04/11/2021 in [place] No Risk No Risk No Risk        Assessment and Plan:  RYLEEANN RYERSON is a 27 y.o. year old female with a history of OCD, anxiety, history of ADHD, who presents for follow up appointment for below.   1. Mixed obsessional thoughts and acts 2. Major depressive disorder with single episode, in partial remission ([place]) 3. Anxiety state Acute stressors include: work related stress  Other stressors include:  break up, loss of her great uncle   History:    There has been significant improvement in depressive symptoms, and OCD symptoms since uptitration of Lexapro.  She reports good support from her friends and her family.  Will continue current dose of Lexapro to target depression, OCD and anxiety.    # Insomnia She was advised to undergo a sleep evaluation due to a history of snoring and middle insomnia. However, she would like to prioritize working on her daily routine and prefers to hold off on the referral for now.  # hirsutism # Acne Improving. She reports above symptoms since starting Lexapro, though denies any worsening after uptiration She is in the process of establishing PCP care to rule out PCOS/any other medical condition contributing to this.    # Alcohol use Improving.  She has a history of binge alcohol use in the context of stress.  She has cut down alcohol use, and is motivated for sobriety.  Discussed potential option of pharmacological treatment if any worsening.  Will continue motivational interview.    # Binge eating Improving.   She is willing to work on diet and exercise.  Discussed potential option of pharmacological treatment in the future.  May consider topiramate given she had a worsening in OCD at higher dose of Vyvanse.    # history of ADHD Unchanged. She sees [place] and attentional  specialist.      Plan Continue lexapro 20 mg daily - monitor hirsutism, acne Obtain labs (CBC, TSH) She was advised again to establish care with PCP for evaluation of hirsutism/acne Next appointment- 4/19  at 10 am for 30 mins, video - on Vyvanse 20  mg daily (worsening in OCD at higher dose)   Past trials of medication: sertraline,    The patient demonstrates the following risk factors for suicide: Chronic risk factors for suicide include: psychiatric disorder of anxiety. Acute risk factors for suicide include: N/A. Protective factors for this patient include: positive social support, responsibility to others (children, family), coping skills and hope for the future. Considering these factors, the overall suicide risk at this point appears to be low. Patient is appropriate for outpatient follow up.        Collaboration of Care: Collaboration of Care: Other reviewed notes in Epic  Patient/Guardian was advised Release of Information must be obtained prior to any record release in order to collaborate their care with an outside provider. Patient/Guardian was advised if they have not already done so to contact the registration department to sign all necessary forms in order for [place] to release information regarding their care.   Consent: Patient/Guardian gives verbal consent for treatment and assignment of benefits for services provided during this visit. Patient/Guardian expressed understanding and agreed to proceed.    Norman Clay, MD 10/05/2022, 10:28 AM

## 2022-10-05 ENCOUNTER — Encounter: Payer: Self-pay | Admitting: Psychiatry

## 2022-10-05 ENCOUNTER — Telehealth (INDEPENDENT_AMBULATORY_CARE_PROVIDER_SITE_OTHER): Payer: BC Managed Care – PPO | Admitting: Psychiatry

## 2022-10-05 DIAGNOSIS — F324 Major depressive disorder, single episode, in partial remission: Secondary | ICD-10-CM

## 2022-10-05 DIAGNOSIS — F422 Mixed obsessional thoughts and acts: Secondary | ICD-10-CM | POA: Diagnosis not present

## 2022-10-05 DIAGNOSIS — F411 Generalized anxiety disorder: Secondary | ICD-10-CM | POA: Diagnosis not present

## 2022-10-05 MED ORDER — ESCITALOPRAM OXALATE 10 MG PO TABS: 15.0000 mg | ORAL_TABLET | Freq: Every day | ORAL | 0 refills | Status: DC

## 2022-10-05 NOTE — Patient Instructions (Signed)
Continue lexapro 20 mg daily  Obtain labs (CBC, TSH) Next appointment- 4/19  at 10 am

## 2022-10-26 ENCOUNTER — Other Ambulatory Visit (HOSPITAL_COMMUNITY): Payer: Self-pay

## 2022-10-26 MED ORDER — LISDEXAMFETAMINE DIMESYLATE 20 MG PO CAPS
20.0000 mg | ORAL_CAPSULE | Freq: Every day | ORAL | 0 refills | Status: DC
  Filled 2022-10-26: qty 30, 30d supply, fill #0

## 2022-12-04 NOTE — Progress Notes (Signed)
Virtual Visit via Video Note  I connected with Sandra Dawson on 12/07/22 at 10:00 AM EDT by a video enabled telemedicine application and verified that I am speaking with the correct person using two identifiers.  Location: Patient: home Provider: office Persons participated in the visit- patient, provider    I discussed the limitations of evaluation and management by telemedicine and the availability of in person appointments. The patient expressed understanding and agreed to proceed.     I discussed the assessment and treatment plan with the patient. The patient was provided an opportunity to ask questions and all were answered. The patient agreed with the plan and demonstrated an understanding of the instructions.   The patient was advised to call back or seek an in-person evaluation if the symptoms worsen or if the condition fails to improve as anticipated.  I provided 25 minutes of non-face-to-face time during this encounter.   Neysa Hotter, MD      [facility] MD/PA/NP OP Progress Note  12/07/2022 10:36 AM Sandra Dawson  MRN:  [id number]  Chief Complaint:  Chief Complaint  Patient presents with   Follow-up   HPI:  This is a follow-up appointment for OCD, depression and anxiety.  She states that she was feeling up and down.  She was told by her close friend that she would not contact her anymore for the well being.  She was the first friend she had after a bad relationship.  However, she had a good support from another friend, and her mood has been better.  She also states that the work has been much better.  She was acknowledged of her growth at work, and she feels good about this.  She is still trying to find a job before the termination of lease.  She has been trying to focusing on more on herself.  She has noticed weight gain, and has been trying to eat healthier food.  She has middle insomnia.  Although there was a time she was not motivated to do things, it has been  better.  She denies SI.  It takes 15-30 mins for taking a shower, and she denies any concern about OCD symptoms.  She drinks up to 2 margaritas or glasses of wine a few times per week.  She denies drug use.  She has not been able to visit her PCP, partly due to financial strain.  She agrees to try making an appointment for evaluation when feasible.    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 She used to have another job until July 2020 Support: sister, mother Household: boyfriend of six years Marital status: Number of children: 0 She was raised and grew up in [place] Education: [employer], graduated in 2018, Glass blower/designer in Primary school teacher  Visit Diagnosis:    ICD-10-CM   1. Mixed obsessional thoughts and acts  F42.2     2. Major depressive disorder with single episode, in partial remission  F32.4     3. Anxiety state  F41.1     4. Insomnia, unspecified type  G47.00       Past Psychiatric History: Please see initial evaluation for full details. I have reviewed the history. No updates at this time.     Past Medical History:  Past Medical  History:  Diagnosis Date   Acid reflux    ADHD (attention deficit hyperactivity disorder)    History of chicken pox     Past Surgical History:  Procedure Laterality Date   MYRINGOTOMY WITH TUBE PLACEMENT     NO PAST SURGERIES      Family Psychiatric History: Please see initial evaluation for full details. I have reviewed the history. No updates at this time.     Family History:  Family History  Problem Relation Age of Onset   Hyperlipidemia Father    Diabetes Father    ADD / ADHD Sister    Depression Sister    Eating disorder Sister    Pulmonary fibrosis Maternal Grandmother    Lung cancer Maternal Grandfather    Dementia Maternal Grandfather     Bladder Cancer Paternal Grandmother    Heart disease Paternal Grandfather    Heart attack Paternal Grandfather    Healthy Mother     Social History:  Social History   Socioeconomic History   Marital status: Single    Spouse name: Not on file   Number of children: 0   Years of education: Not on file   Highest education level: Not on file  Occupational History   Occupation: Management consultant  Tobacco Use   Smoking status: Never   Smokeless tobacco: Never  Vaping Use   Vaping Use: Never used  Substance and Sexual Activity   Alcohol use: Yes    Alcohol/week: 3.0 standard drinks of alcohol    Types: 3 Cans of beer per week   Drug use: Never   Sexual activity: Yes    Birth control/protection: Pill  Other Topics Concern   Not on file  Social History Narrative   In college - Production designer, theatre/television/film regularly   Social Determinants of Health   Financial Resource Strain: Not on file  Food Insecurity: Not on file  Transportation Needs: Not on file  Physical Activity: Not on file  Stress: Not on file  Social Connections: Not on file    Allergies: No Known Allergies  Metabolic Disorder Labs: No results found for: "HGBA1C", "MPG" No results found for: "PROLACTIN" Lab Results  Component Value Date   CHOL 169 01/26/2014   TRIG 73.0 01/26/2014   HDL 62.00 01/26/2014   CHOLHDL 3 01/26/2014   VLDL 14.6 01/26/2014   LDLCALC 92 01/26/2014   Lab Results  Component Value Date   TSH 2.02 01/26/2014    Therapeutic Level Labs: No results found for: "LITHIUM" No results found for: "VALPROATE" No results found for: "CBMZ"  Current Medications: Current Outpatient Medications  Medication Sig Dispense Refill   escitalopram (LEXAPRO) 20 MG tablet Take 1 tablet (20 mg total) by mouth daily. 90 tablet 0   ipratropium (ATROVENT) 0.03 % nasal spray Place 2 sprays into both nostrils every 12 (twelve) hours. 30 mL 0   levonorgestrel-ethinyl estradiol  (ALESSE) 0.1-20 MG-MCG tablet TAKE 1 TABLET BY MOUTH ONCE DAILY 84 tablet 4   lisdexamfetamine (VYVANSE) 20 MG capsule Take 1 capsule (20 mg total) by mouth daily. 30 capsule 0   Multiple Vitamin (MULTIVITAMIN) tablet Take 1 tablet by mouth daily.     pantoprazole (PROTONIX) 40 MG tablet TAKE 1 TABLET BY MOUTH EVERY DAY 30 tablet 0   No current facility-administered medications for this visit.     Musculoskeletal: Strength & Muscle Tone:  N/A Gait & Station:  N/A Patient leans: N/A  Psychiatric Specialty Exam: Review  of Systems  Psychiatric/Behavioral:  Positive for dysphoric mood and sleep disturbance. Negative for agitation, behavioral problems, confusion, decreased concentration, hallucinations, self-injury and suicidal ideas. The patient is not nervous/anxious and is not hyperactive.   All other systems reviewed and are negative.   There were no vitals taken for this visit.There is no height or weight on file to calculate BMI.  General Appearance: Fairly Groomed  Eye Contact:  Good  Speech:  Clear and Coherent  Volume:  Normal  Mood:   good  Affect:  Appropriate, Congruent, and calm  Thought Process:  Coherent  Orientation:  Full (Time, Place, and Person)  Thought Content: Logical   Suicidal Thoughts:  No  Homicidal Thoughts:  No  Memory:  Immediate;   Good  Judgement:  Good  Insight:  Good  Psychomotor Activity:  Normal  Concentration:  Concentration: Good and Attention Span: Good  Recall:  Good  Fund of Knowledge: Good  Language: Good  Akathisia:  No  Handed:  Right  AIMS (if indicated): not done  Assets:  Communication Skills Desire for Improvement  ADL's:  Intact  Cognition: WNL  Sleep:  Poor   Screenings: [employer] [facility] Office Visit from 11/13/2021 in [facility] Video Visit from 06/09/2021 in [facility] Video Visit from 04/11/2021 in [facility]  [facility] Office Visit from 12/07/2020 in [facility] Nutrition from 04/01/2014 in [place] [facility] Total Score 2 1 0 3 0  PHQ-9 Total Score 7 -- -- 9 --      Flow[facility] [place] Office Visit from 11/13/2021 in [facility] Video Visit from 06/09/2021 in [facility] Video Visit from 04/11/2021 in [facility]  C-SSRS RISK CATEGORY No Risk No Risk No Risk        Assessment and Plan:  KOMAL STANGELO is a 27 y.o. year old female with a history of OCD, anxiety, history of ADHD, who presents for follow up appointment for below.   1. Mixed obsessional thoughts and acts 2. Major depressive disorder with single episode, in partial remission 3. Anxiety state Acute stressors include: work related stress, conflict with her friend  Other stressors include:  break up, loss of her great uncle   History:    Although she had an episode of down mood in the context of stressors as above, it has been overall manageable, and she denies any significant OCD symptoms since the last visit.  Will continue current dose of Lexapro to target depression, OCD and anxiety.   4. Insomnia, unspecified type She has middle insomnia, and has snoring.  She reports weight gain, and is working on this.  She is recommended for evaluation of sleep apnea.    # hirsutism # Acne # Weight gain She has had weight gain over the past few years, which she partly attributes to her choice of food.  She is advised to establish care with her PCP to rule out PCOS/any other medical condition contributing to this.   # Alcohol use Improving.  She has a history of binge alcohol use in the context of stress.  She has cut down alcohol use, and is motivated for sobriety.  Discussed potential option of pharmacological  treatment if any worsening.  Will continue motivational interview.    # history of ADHD Unchanged.  She sees [place] and attentional specialist.      Plan Continue lexapro 20 mg daily - monitor hirsutism, acne 3/29 Obtain labs (CBC, TSH) She was advised again to establish care with PCP for evaluation of hirsutism/acne/weight gain Next appointment- 5/31 at 8 30 am for 30 mins, video - on Vyvanse 20 mg daily (worsening in OCD at higher dose)   Past trials of medication: sertraline,    The patient demonstrates the following risk factors for suicide: Chronic risk factors for suicide include: psychiatric disorder of anxiety. Acute risk factors for suicide include: N/A. Protective factors for this patient include: positive social support, responsibility to others (children, family), coping skills and hope for the future. Considering these factors, the overall suicide risk at this point appears to be low. Patient is appropriate for outpatient follow up.      Collaboration of Care: Collaboration of Care: Other reviewed notes in Epic  Patient/Guardian was advised Release of Information must be obtained prior to any record release in order to collaborate their care with an outside provider. Patient/Guardian was advised if they have not already done so to contact the registration department to sign all necessary forms in order for [place] to release information regarding their care.   Consent: Patient/Guardian gives verbal consent for treatment and assignment of benefits for services provided during this visit. Patient/Guardian expressed understanding and agreed to proceed.    Neysa Hotter, MD 12/07/2022, 10:36 AM

## 2022-12-07 ENCOUNTER — Encounter: Payer: Self-pay | Admitting: Psychiatry

## 2022-12-07 ENCOUNTER — Telehealth (INDEPENDENT_AMBULATORY_CARE_PROVIDER_SITE_OTHER): Payer: BC Managed Care – PPO | Admitting: Psychiatry

## 2022-12-07 DIAGNOSIS — G4700 Insomnia, unspecified: Secondary | ICD-10-CM

## 2022-12-07 DIAGNOSIS — F422 Mixed obsessional thoughts and acts: Secondary | ICD-10-CM

## 2022-12-07 DIAGNOSIS — F411 Generalized anxiety disorder: Secondary | ICD-10-CM

## 2022-12-07 DIAGNOSIS — F324 Major depressive disorder, single episode, in partial remission: Secondary | ICD-10-CM | POA: Diagnosis not present

## 2022-12-07 MED ORDER — ESCITALOPRAM OXALATE 20 MG PO TABS
20.0000 mg | ORAL_TABLET | Freq: Every day | ORAL | 0 refills | Status: DC
Start: 2022-12-07 — End: 2023-03-01

## 2022-12-21 ENCOUNTER — Other Ambulatory Visit (HOSPITAL_COMMUNITY): Payer: Self-pay

## 2022-12-21 MED ORDER — LISDEXAMFETAMINE DIMESYLATE 20 MG PO CAPS
20.0000 mg | ORAL_CAPSULE | Freq: Every day | ORAL | 0 refills | Status: DC
  Filled 2022-12-21: qty 30, 30d supply, fill #0

## 2023-01-12 NOTE — Progress Notes (Signed)
Virtual Visit via Video Note  I connected with Sandra Dawson on 01/18/23 at  8:30 AM EDT by a video enabled telemedicine application and verified that I am speaking with the correct person using two identifiers.  Location: Patient: home Provider: office Persons participated in the visit- patient, provider    I discussed the limitations of evaluation and management by telemedicine and the availability of in person appointments. The patient expressed understanding and agreed to proceed.    I discussed the assessment and treatment plan with the patient. The patient was provided an opportunity to ask questions and all were answered. The patient agreed with the plan and demonstrated an understanding of the instructions.   The patient was advised to call back or seek an in-person evaluation if the symptoms worsen or if the condition fails to improve as anticipated.  I provided 15 minutes of non-face-to-face time during this encounter.   Neysa Hotter, MD    [facility] MD/PA/NP OP Progress Note  01/18/2023 9:01 AM Sandra Dawson  MRN:  [id number]  Chief Complaint:  Chief Complaint  Patient presents with   Follow-up   HPI:  This is a follow-up appointment for OCD, depression and anxiety.  She states that she was let go from work.  She feels confused, and she is unsure as to why this happened.  She reports great support system, including her family and her friends.  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  Although she was planning to go out for a date, she had to tell him to cancel due to her significant anxiety.  She has been feeling tense and numb.  She also reports financial stress in relation to recent vet appointment.  She is trying to find positive.  This may be the push she needed.  She adamantly denies SI, stating that she has a goal of having the family.  She has insomnia.  She has decrease in appetite, although she make sure to eat regularly.  She had a panic attack.  She has been  able to take shower without problems, and denies any significant OCD symptoms.  She drinks up to a few drinks once a week. She denies drug use. She feels comfortable to stay on the medication as it is.  Visit Diagnosis:    ICD-10-CM   1. Mixed obsessional thoughts and acts  F42.2     2. Mild episode of recurrent major depressive disorder (HCC)  F33.0     3. Anxiety state  F41.1       Past Psychiatric History: Please see initial evaluation for full details. I have reviewed the history. No updates at this time.     Past Medical History:  Past Medical History:  Diagnosis Date   Acid reflux    ADHD (attention deficit hyperactivity disorder)    History of chicken pox     Past Surgical History:  Procedure Laterality Date   MYRINGOTOMY WITH TUBE PLACEMENT     NO PAST SURGERIES      Family Psychiatric History: Please see initial evaluation for full details. I have reviewed the history. No updates at this time.     Family History:  Family History  Problem Relation Age of Onset   Hyperlipidemia Father    Diabetes Father    ADD / ADHD Sister    Depression Sister    Eating disorder Sister    Pulmonary fibrosis Maternal Grandmother    Lung cancer Maternal Grandfather    Dementia  Maternal Grandfather    Bladder Cancer Paternal Grandmother    Heart disease Paternal Grandfather    Heart attack Paternal Grandfather    Healthy Mother     Social History:  Social History   Socioeconomic History   Marital status: Single    Spouse name: Not on file   Number of children: 0   Years of education: Not on file   Highest education level: Not on file  Occupational History   Occupation: Management consultant  Tobacco Use   Smoking status: Never   Smokeless tobacco: Never  Vaping Use   Vaping Use: Never used  Substance and Sexual Activity   Alcohol use: Yes    Alcohol/week: 3.0 standard drinks of alcohol    Types: 3 Cans of beer per week   Drug use: Never   Sexual activity:  Yes    Birth control/protection: Pill  Other Topics Concern   Not on file  Social History Narrative   In college - Production designer, theatre/television/film regularly   Social Determinants of Health   Financial Resource Strain: Not on file  Food Insecurity: Not on file  Transportation Needs: Not on file  Physical Activity: Not on file  Stress: Not on file  Social Connections: Not on file    Allergies: No Known Allergies  Metabolic Disorder Labs: No results found for: "HGBA1C", "MPG" No results found for: "PROLACTIN" Lab Results  Component Value Date   CHOL 169 01/26/2014   TRIG 73.0 01/26/2014   HDL 62.00 01/26/2014   CHOLHDL 3 01/26/2014   VLDL 14.6 01/26/2014   LDLCALC 92 01/26/2014   Lab Results  Component Value Date   TSH 2.02 01/26/2014    Therapeutic Level Labs: No results found for: "LITHIUM" No results found for: "VALPROATE" No results found for: "CBMZ"  Current Medications: Current Outpatient Medications  Medication Sig Dispense Refill   escitalopram (LEXAPRO) 20 MG tablet Take 1 tablet (20 mg total) by mouth daily. 90 tablet 0   ipratropium (ATROVENT) 0.03 % nasal spray Place 2 sprays into both nostrils every 12 (twelve) hours. 30 mL 0   levonorgestrel-ethinyl estradiol (ALESSE) 0.1-20 MG-MCG tablet TAKE 1 TABLET BY MOUTH ONCE DAILY 84 tablet 4   lisdexamfetamine (VYVANSE) 20 MG capsule Take 1 capsule (20 mg total) by mouth daily. 30 capsule 0   Multiple Vitamin (MULTIVITAMIN) tablet Take 1 tablet by mouth daily.     pantoprazole (PROTONIX) 40 MG tablet TAKE 1 TABLET BY MOUTH EVERY DAY 30 tablet 0   No current facility-administered medications for this visit.     Musculoskeletal: Strength & Muscle Tone:  N/A Gait & Station:  N/A Patient leans: N/A  Psychiatric Specialty Exam: Review of Systems  Psychiatric/Behavioral:  Positive for decreased concentration and sleep disturbance. Negative for agitation, behavioral problems, confusion, dysphoric  mood, hallucinations, self-injury and suicidal ideas. The patient is nervous/anxious. The patient is not hyperactive.   All other systems reviewed and are negative.   There were no vitals taken for this visit.There is no height or weight on file to calculate BMI.  General Appearance: Fairly Groomed  Eye Contact:  Good  Speech:  Clear and Coherent  Volume:  Normal  Mood:  Anxious  Affect:  Appropriate, Congruent, and calm  Thought Process:  Coherent  Orientation:  Full (Time, Place, and Person)  Thought Content: Logical   Suicidal Thoughts:  No  Homicidal Thoughts:  No  Memory:  Immediate;   Good  Judgement:  Good  Insight:  Good  Psychomotor Activity:  Normal  Concentration:  Concentration: Good and Attention Span: Good  Recall:  Good  Fund of Knowledge: Good  Language: Good  Akathisia:  No  Handed:  Right  AIMS (if indicated): not done  Assets:  Communication Skills Desire for Improvement  ADL's:  Intact  Cognition: WNL  Sleep:  Poor   Screenings: PHQ2-9    Flowsheet Row Office Visit from 11/13/2021 in [facility] Video Visit from 06/09/2021 in [facility] Video Visit from 04/11/2021 in [facility] Office Visit from 12/07/2020 in [facility] Nutrition from 04/01/2014 in [facility] Education [facility] at [facility] Total Score 2 1 0 3 0  PHQ-9 Total Score 7 -- -- 9 --      Flowsheet Row Office Visit from 11/13/2021 in [facility] Video Visit from 06/09/2021 in [facility] Video Visit from 04/11/2021 in [facility]  C-SSRS RISK CATEGORY No Risk No Risk No Risk        Assessment and Plan:  Sandra Dawson is a 27 y.o. year old female with a history of OCD, anxiety,  history of ADHD, who presents for follow up appointment for below.    1. Mixed obsessional thoughts and acts 2. Mild episode of recurrent major depressive disorder (HCC) 3. Anxiety state Acute stressors include: being laid off from work  Other stressors include:  break up, loss of her great uncle   History:     There has been slight worsening in anxiety in the setting of stressors as above, although she has been able to manage OCD symptoms..  She feels comfortable to stay on the current medication regimen given her current mood symptoms may be situational.  Will continue current dose of Lexapro to target depression, OCD and anxiety.   4. Insomnia, unspecified type Worsening in the setting of stressor as above. She has middle insomnia, and has snoring.  She reports weight gain, and is working on this.  Previously recommended for evaluation of sleep apnea.    # hirsutism # Acne # Weight gain She has had weight gain over the past few years, which she partly attributes to her choice of food.  She is advised to establish care with her PCP to rule out PCOS/any other medical condition contributing to this.    # Alcohol use Improving.  She has a history of binge alcohol use in the context of stress.  She has cut down alcohol use, and is motivated for sobriety.  Discussed potential option of pharmacological treatment if any worsening.  Will continue motivational interview.    # history of ADHD Unchanged. She sees [place] and attentional specialist.      Plan Continue lexapro 20 mg daily - monitor hirsutism, acne 3/29 Obtain labs (CBC, TSH) She was advised again to establish care with PCP for evaluation of hirsutism/acne/weight gain Next appointment- 7/12 at 10 30 for 30 mins, video - on Vyvanse 20 mg daily (worsening in OCD at higher dose)   Past trials of medication: sertraline,    The patient demonstrates the following risk factors for suicide: Chronic risk factors for suicide include:  psychiatric disorder of anxiety. Acute risk factors for suicide include: N/A. Protective factors for this patient include: positive social support, responsibility to others (children, family), coping skills and  hope for the future. Considering these factors, the overall suicide risk at this point appears to be low. Patient is appropriate for outpatient follow up.      Collaboration of Care: Collaboration of Care: Other reviewed notes in Epic  Patient/Guardian was advised Release of Information must be obtained prior to any record release in order to collaborate their care with an outside provider. Patient/Guardian was advised if they have not already done so to contact the registration department to sign all necessary forms in order for [place] to release information regarding their care.   Consent: Patient/Guardian gives verbal consent for treatment and assignment of benefits for services provided during this visit. Patient/Guardian expressed understanding and agreed to proceed.    Neysa Hotter, MD 01/18/2023, 9:01 AM

## 2023-01-18 ENCOUNTER — Encounter: Payer: Self-pay | Admitting: Psychiatry

## 2023-01-18 ENCOUNTER — Telehealth (INDEPENDENT_AMBULATORY_CARE_PROVIDER_SITE_OTHER): Payer: BC Managed Care – PPO | Admitting: Psychiatry

## 2023-01-18 DIAGNOSIS — F330 Major depressive disorder, recurrent, mild: Secondary | ICD-10-CM | POA: Diagnosis not present

## 2023-01-18 DIAGNOSIS — F422 Mixed obsessional thoughts and acts: Secondary | ICD-10-CM

## 2023-01-18 DIAGNOSIS — F411 Generalized anxiety disorder: Secondary | ICD-10-CM | POA: Diagnosis not present

## 2023-02-25 NOTE — Progress Notes (Signed)
Virtual Visit via Video Note  I connected with Sandra Dawson on 03/01/23 at 10:30 AM EDT by a video enabled telemedicine application and verified that I am speaking with the correct person using two identifiers.  Location: Patient: home Provider: office Persons participated in the visit- patient, provider    I discussed the limitations of evaluation and management by telemedicine and the availability of in person appointments. The patient expressed understanding and agreed to proceed.     I discussed the assessment and treatment plan with the patient. The patient was provided an opportunity to ask questions and all were answered. The patient agreed with the plan and demonstrated an understanding of the instructions.   The patient was advised to call back or seek an in-person evaluation if the symptoms worsen or if the condition fails to improve as anticipated.  I provided 20 minutes of non-face-to-face time during this encounter.   Neysa Hotter, MD     [facility] MD/PA/NP OP Progress Note  03/01/2023 11:37 AM Sandra Dawson  MRN:  [id number]  Chief Complaint:  Chief Complaint  Patient presents with   Follow-up   HPI:  This is a follow-up appointment for OCD, depression and anxiety.  She states that she has been doing good.  She has started a part-time at [employer].  Time flies having fun.  She was offered a promotion to Education officer, community, although she has been there only for 2 weeks.  She has been able to wake up earlier.  She is planning to stay in [place] until her lease ends. She is hoping to move to smaller city afterwards. 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  She states that her ex-boyfriend from last year added her on snapshot.  Although she feels sad, she was feeling better after communicating with her friends and sister, who was supportive to her.  She is taking care of her dog with worms.  Although she  dropped her medication on floors by accident, she was not as stressed out like few years ago.  She denies obsessions or compulsions.  She denies stress about taking a shower.  She denies feeling depressed.  She may feel anxious at times.  She denies change in appetite.  She denies SI.  She drinks 2 glasses of wine, a few times per week.  She denies craving for alcohol.  Although she would keep in mind about pharmacological options, she is not interested at this time.  She denies drug use.   Visit Diagnosis:    ICD-10-CM   1. Mixed obsessional thoughts and acts  F42.2     2. MDD (major depressive disorder), recurrent, in partial remission (HCC)  F33.41     3. Anxiety state  F41.1       Past Psychiatric History: Please see initial evaluation for full details. I have reviewed the history. No updates at this time.     Past Medical History:  Past Medical History:  Diagnosis Date   Acid reflux    ADHD (attention deficit hyperactivity disorder)    History of chicken pox     Past Surgical History:  Procedure Laterality Date   MYRINGOTOMY WITH TUBE PLACEMENT     NO PAST SURGERIES      Family Psychiatric History: Please see initial evaluation for full details. I have reviewed the history. No updates at this time.     Family History:  Family History  Problem Relation Age  of Onset   Hyperlipidemia Father    Diabetes Father    ADD / ADHD Sister    Depression Sister    Eating disorder Sister    Pulmonary fibrosis Maternal Grandmother    Lung cancer Maternal Grandfather    Dementia Maternal Grandfather    Bladder Cancer Paternal Grandmother    Heart disease Paternal Grandfather    Heart attack Paternal Grandfather    Healthy Mother     Social History:  Social History   Socioeconomic History   Marital status: Single    Spouse name: Not on file   Number of children: 0   Years of education: Not on file   Highest education level: Not on file  Occupational History   Occupation:  Management consultant  Tobacco Use   Smoking status: Never   Smokeless tobacco: Never  Vaping Use   Vaping status: Never Used  Substance and Sexual Activity   Alcohol use: Yes    Alcohol/week: 3.0 standard drinks of alcohol    Types: 3 Cans of beer per week   Drug use: Never   Sexual activity: Yes    Birth control/protection: Pill  Other Topics Concern   Not on file  Social History Narrative   In college - Production designer, theatre/television/film regularly   Social Determinants of Health   Financial Resource Strain: Not on file  Food Insecurity: Not on file  Transportation Needs: Not on file  Physical Activity: Not on file  Stress: Not on file  Social Connections: Not on file    Allergies: No Known Allergies  Metabolic Disorder Labs: No results found for: "HGBA1C", "MPG" No results found for: "PROLACTIN" Lab Results  Component Value Date   CHOL 169 01/26/2014   TRIG 73.0 01/26/2014   HDL 62.00 01/26/2014   CHOLHDL 3 01/26/2014   VLDL 14.6 01/26/2014   LDLCALC 92 01/26/2014   Lab Results  Component Value Date   TSH 2.02 01/26/2014    Therapeutic Level Labs: No results found for: "LITHIUM" No results found for: "VALPROATE" No results found for: "CBMZ"  Current Medications: Current Outpatient Medications  Medication Sig Dispense Refill   [START ON 03/07/2023] escitalopram (LEXAPRO) 20 MG tablet Take 1 tablet (20 mg total) by mouth daily. 90 tablet 0   ipratropium (ATROVENT) 0.03 % nasal spray Place 2 sprays into both nostrils every 12 (twelve) hours. 30 mL 0   levonorgestrel-ethinyl estradiol (ALESSE) 0.1-20 MG-MCG tablet TAKE 1 TABLET BY MOUTH ONCE DAILY 84 tablet 4   lisdexamfetamine (VYVANSE) 20 MG capsule Take 1 capsule (20 mg total) by mouth daily. 30 capsule 0   Multiple Vitamin (MULTIVITAMIN) tablet Take 1 tablet by mouth daily.     pantoprazole (PROTONIX) 40 MG tablet TAKE 1 TABLET BY MOUTH EVERY DAY 30 tablet 0   No current facility-administered  medications for this visit.     Musculoskeletal: Strength & Muscle Tone:  N/A Gait & Station:  N/A Patient leans: N/A  Psychiatric Specialty Exam: Review of Systems  Psychiatric/Behavioral:  Negative for agitation, behavioral problems, confusion, decreased concentration, dysphoric mood, hallucinations, self-injury, sleep disturbance and suicidal ideas. The patient is nervous/anxious. The patient is not hyperactive.   All other systems reviewed and are negative.   There were no vitals taken for this visit.There is no height or weight on file to calculate BMI.  General Appearance: Fairly Groomed  Eye Contact:  Good  Speech:  Clear and Coherent  Volume:  Normal  Mood:  better  Affect:  Appropriate, Congruent, and calm  Thought Process:  Coherent  Orientation:  Full (Time, Place, and Person)  Thought Content: Logical   Suicidal Thoughts:  No  Homicidal Thoughts:  No  Memory:  Immediate;   Good  Judgement:  Good  Insight:  Good  Psychomotor Activity:  Normal  Concentration:  Concentration: Good and Attention Span: Good  Recall:  Good  Fund of Knowledge: Good  Language: Good  Akathisia:  No  Handed:  Right  AIMS (if indicated): not done  Assets:  Communication Skills Desire for Improvement  ADL's:  Intact  Cognition: WNL  Sleep:  Good   Screenings: PHQ2-9    Flowsheet Row Office Visit from 11/13/2021 in [facility] Video Visit from 06/09/2021 in [facility] Video Visit from 04/11/2021 in [facility] Office Visit from 12/07/2020 in [facility] Nutrition from 04/01/2014 in [facility] at [facility] Total Score 2 1 0 3 0  PHQ-9 Total Score 7 -- -- 9 --      Flowsheet Row Office Visit from 11/13/2021 in [facility] Video Visit from  06/09/2021 in [facility] Video Visit from 04/11/2021 in [facility]  C-SSRS RISK CATEGORY No Risk No Risk No Risk        Assessment and Plan:  Sandra Dawson is a 27 y.o. year old female with a history of OCD, anxiety, history of ADHD, who presents for follow up appointment for below.   1. Mixed obsessional thoughts and acts 2. MDD (major depressive disorder), recurrent, in partial remission (HCC) 3. Anxiety state Acute stressors include: being laid off from work  Other stressors include:  break up, loss of her great uncle   History:      There has been overall improvement in anxiety, depressive symptoms, and she denies any significant concern about her OCD symptoms.  Will continue current dose of Lexapro to target depression, OCD and anxiety.  She was advised again to obtain labs to rule out medical health condition contributing to her symptoms.    4. Insomnia, unspecified type Improving She has middle insomnia, and has snoring.  She reports weight gain, and is working on this.  Previously recommended for evaluation of sleep apnea.    # hirsutism # Acne # Weight gain She has had weight gain over the past few years, which she partly attributes to her choice of food.  She is advised to establish care with her PCP to rule out PCOS/any other medical condition contributing to this.    # Alcohol use Improving.  She has a history of binge alcohol use in the context of stress.  She has cut down alcohol use, and is motivated for sobriety.  She is not interested in pharmacological treatment at this time.  Will continue motivational interview.   # history of ADHD Unchanged. She sees [place] and attentional specialist.      Plan Continue lexapro 20 mg daily - monitor hirsutism, acne 3/29 Obtain labs (CBC, TSH) She was advised again to establish care with PCP for evaluation of hirsutism/acne/weight gain Next  appointment-  9/6 at 9 30 for 30 mins, video - on Vyvanse 20 mg daily (worsening in OCD at higher dose)   Past trials of medication: sertraline,    The patient demonstrates the following risk  factors for suicide: Chronic risk factors for suicide include: psychiatric disorder of anxiety. Acute risk factors for suicide include: N/A. Protective factors for this patient include: positive social support, responsibility to others (children, family), coping skills and hope for the future. Considering these factors, the overall suicide risk at this point appears to be low. Patient is appropriate for outpatient follow up.    Collaboration of Care: Collaboration of Care: Other reviewed notes in Epic  Patient/Guardian was advised Release of Information must be obtained prior to any record release in order to collaborate their care with an outside provider. Patient/Guardian was advised if they have not already done so to contact the registration department to sign all necessary forms in order for [place] to release information regarding their care.   Consent: Patient/Guardian gives verbal consent for treatment and assignment of benefits for services provided during this visit. Patient/Guardian expressed understanding and agreed to proceed.    Neysa Hotter, MD 03/01/2023, 11:37 AM

## 2023-03-01 ENCOUNTER — Encounter: Payer: Self-pay | Admitting: Psychiatry

## 2023-03-01 ENCOUNTER — Telehealth: Payer: Medicaid Other | Admitting: Psychiatry

## 2023-03-01 DIAGNOSIS — F411 Generalized anxiety disorder: Secondary | ICD-10-CM

## 2023-03-01 DIAGNOSIS — F422 Mixed obsessional thoughts and acts: Secondary | ICD-10-CM | POA: Diagnosis not present

## 2023-03-01 DIAGNOSIS — F3341 Major depressive disorder, recurrent, in partial remission: Secondary | ICD-10-CM | POA: Diagnosis not present

## 2023-03-01 MED ORDER — ESCITALOPRAM OXALATE 20 MG PO TABS: 20.0000 mg | ORAL_TABLET | Freq: Every day | ORAL | 0 refills | Status: DC

## 2023-03-11 ENCOUNTER — Other Ambulatory Visit (HOSPITAL_COMMUNITY): Payer: Self-pay

## 2023-04-21 NOTE — Progress Notes (Signed)
Virtual Visit via Video Note  I connected with Sandra Dawson on 04/26/23 at  9:30 AM EDT by a video enabled telemedicine application and verified that I am speaking with the correct person using two identifiers.  Location: Patient: home Provider: office Persons participated in the visit- patient, provider    I discussed the limitations of evaluation and management by telemedicine and the availability of in person appointments. The patient expressed understanding and agreed to proceed.    I discussed the assessment and treatment plan with the patient. The patient was provided an opportunity to ask questions and all were answered. The patient agreed with the plan and demonstrated an understanding of the instructions.   The patient was advised to call back or seek an in-person evaluation if the symptoms worsen or if the condition fails to improve as anticipated.  I provided 17 minutes of non-face-to-face time during this encounter.   Neysa Hotter, MD    [facility] MD/PA/NP OP Progress Note  04/26/2023 10:00 AM Sandra Dawson  MRN:  [id number]  Chief Complaint:  Chief Complaint  Patient presents with   Follow-up   HPI:  This is a follow-up appointment for OCD, depression and anxiety.  She states that she has been sick due to strep throat.  It has been getting slightly better since taking antibiotics.  She continues to work at [employer].  It has been a little busier since the school has started.  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  She has not heard back from people she reached out to.  She feels that she has been working so hard, and feels mentally exhausted at the end of the day.  However, she usually has good energy in the morning.  She enjoys meeting with her friends when she does not work.  Although she feels irritable with some coworkers, she does not feel that way so often.  Her sleep has been consistent.  She denies change in appetite.  She is aware of her weight  gain.  She states that she had weight gain after the break-up as she was eating out most of the time.  She believes she will be able to lose weight.  She has not been able to see her PCP as she has been busy.  She denies SI.  Although she occasionally notices that she might be cleaning her hands, it is not consuming.  She denies any issues with taking a shower.  She denies panic attacks.  She feels comfortable to stay on the current medication regimen.   177 lbs Wt Readings from Last 3 Encounters:  11/13/21 164 lb 12.8 oz (74.8 kg)  09/29/20 147 lb (66.7 kg)  07/22/19 145 lb 2 oz (65.8 kg)     Once a week, two glasses  Visit Diagnosis:    ICD-10-CM   1. Mixed obsessional thoughts and acts  F42.2     2. MDD (major depressive disorder), recurrent, in partial remission (HCC)  F33.41     3. Anxiety state  F41.1     4. Insomnia, unspecified type  G47.00     5. Screening for thyroid disorder  Z13.29       Past Psychiatric History: Please see initial evaluation for full details. I have reviewed the history. No updates at this time.     Past Medical History:  Past Medical History:  Diagnosis Date   Acid reflux    ADHD (attention deficit hyperactivity disorder)  History of chicken pox     Past Surgical History:  Procedure Laterality Date   MYRINGOTOMY WITH TUBE PLACEMENT     NO PAST SURGERIES      Family Psychiatric History: Please see initial evaluation for full details. I have reviewed the history. No updates at this time.     Family History:  Family History  Problem Relation Age of Onset   Hyperlipidemia Father    Diabetes Father    ADD / ADHD Sister    Depression Sister    Eating disorder Sister    Pulmonary fibrosis Maternal Grandmother    Lung cancer Maternal Grandfather    Dementia Maternal Grandfather    Bladder Cancer Paternal Grandmother    Heart disease Paternal Grandfather    Heart attack Paternal Grandfather    Healthy Mother     Social History:   Social History   Socioeconomic History   Marital status: Single    Spouse name: Not on file   Number of children: 0   Years of education: Not on file   Highest education level: Not on file  Occupational History   Occupation: Management consultant  Tobacco Use   Smoking status: Never   Smokeless tobacco: Never  Vaping Use   Vaping status: Never Used  Substance and Sexual Activity   Alcohol use: Yes    Alcohol/week: 3.0 standard drinks of alcohol    Types: 3 Cans of beer per week   Drug use: Never   Sexual activity: Yes    Birth control/protection: Pill  Other Topics Concern   Not on file  Social History Narrative   In college - Production designer, theatre/television/film regularly   Social Determinants of Health   Financial Resource Strain: Not on file  Food Insecurity: Not on file  Transportation Needs: Not on file  Physical Activity: Not on file  Stress: Not on file  Social Connections: Unknown (04/24/2023)   Received from [employer]   Social Network    Social Network: Not on file    Allergies: No Known Allergies  Metabolic Disorder Labs: No results found for: "HGBA1C", "MPG" No results found for: "PROLACTIN" Lab Results  Component Value Date   CHOL 169 01/26/2014   TRIG 73.0 01/26/2014   HDL 62.00 01/26/2014   CHOLHDL 3 01/26/2014   VLDL 14.6 01/26/2014   LDLCALC 92 01/26/2014   Lab Results  Component Value Date   TSH 2.02 01/26/2014    Therapeutic Level Labs: No results found for: "LITHIUM" No results found for: "VALPROATE" No results found for: "CBMZ"  Current Medications: Current Outpatient Medications  Medication Sig Dispense Refill   escitalopram (LEXAPRO) 20 MG tablet Take 1 tablet (20 mg total) by mouth daily. 90 tablet 0   ipratropium (ATROVENT) 0.03 % nasal spray Place 2 sprays into both nostrils every 12 (twelve) hours. 30 mL 0   levonorgestrel-ethinyl estradiol (ALESSE) 0.1-20 MG-MCG tablet TAKE 1 TABLET BY MOUTH ONCE DAILY 84  tablet 4   lisdexamfetamine (VYVANSE) 20 MG capsule Take 1 capsule (20 mg total) by mouth daily. 30 capsule 0   Multiple Vitamin (MULTIVITAMIN) tablet Take 1 tablet by mouth daily.     pantoprazole (PROTONIX) 40 MG tablet TAKE 1 TABLET BY MOUTH EVERY DAY 30 tablet 0   No current facility-administered medications for this visit.     Musculoskeletal: Strength & Muscle Tone:  N/A Gait & Station:  N/A Patient leans: N/A  Psychiatric Specialty Exam: Review of Systems  Psychiatric/Behavioral:  Negative for agitation, behavioral problems, confusion, decreased concentration, dysphoric mood, hallucinations, self-injury, sleep disturbance and suicidal ideas. The patient is nervous/anxious. The patient is not hyperactive.   All other systems reviewed and are negative.   There were no vitals taken for this visit.There is no height or weight on file to calculate BMI.  General Appearance: Fairly Groomed  Eye Contact:  Good  Speech:  Clear and Coherent  Volume:  Normal  Mood:   ok  Affect:  Appropriate, Congruent, and slight fatigue  Thought Process:  Coherent  Orientation:  Full (Time, Place, and Person)  Thought Content: Logical   Suicidal Thoughts:  No  Homicidal Thoughts:  No  Memory:  Immediate;   Good  Judgement:  Good  Insight:  Good  Psychomotor Activity:  Normal  Concentration:  Concentration: Good and Attention Span: Good  Recall:  Good  Fund of Knowledge: Good  Language: Good  Akathisia:  No  Handed:  Right  AIMS (if indicated): not done  Assets:  Communication Skills Desire for Improvement  ADL's:  Intact  Cognition: WNL  Sleep:  Fair   Screenings: Equities trader Office Visit from 11/13/2021 in [facility] Video Visit from 06/09/2021 in [facility] Video Visit from 04/11/2021 in [facility] Office Visit from 12/07/2020 in [facility] Nutrition from 04/01/2014 in [facility] Total Score 2 1 0 3 0  PHQ-9 Total Score 7 -- -- 9 --      Flowsheet Row Office Visit from 11/13/2021 in [facility] Video Visit from 06/09/2021 in [facility] Video Visit from 04/11/2021 in [facility]  C-SSRS RISK CATEGORY No Risk No Risk No Risk        Assessment and Plan:  Sandra Dawson is a 27 y.o. year old female with a history of OCD, anxiety, history of ADHD, who presents for follow up appointment for below.   1. Mixed obsessional thoughts and acts 2. MDD (major depressive disorder), recurrent, in partial remission (HCC) 3. Anxiety state Acute stressors include: being laid off from work  Other stressors include:  break up, loss of her great uncle   History:      Although she continues to have occasional OCD symptoms, it has been manageable, and she denies any other significant depressive symptoms or anxiety.  Will obtain labs to rule out medical health issues contributing to her symptoms.   4. Insomnia, unspecified type Overall improving.  She has middle insomnia and snoring.  She is working on weight loss.   Previously recommended for evaluation of sleep apnea.   # weight gain  She attributes weight gain to chang in her diet in relation to stress.  Although the hope was for her to be evaluated by her PCP, she has not been able to establish the care.  Will obtain lab to rule out medical health issues contributing to her symptoms.     # history of ADHD Unchanged. She sees [place] and attentional specialist.      Plan Continue lexapro 20 mg daily - monitor acne  Obtain labs (TSH) - [facility] Next appointment-  12/4 at 1 pm, video - on Vyvanse 20 mg daily (worsening in OCD at higher dose)   Past trials of medication:  sertraline,  The patient demonstrates the following risk factors for suicide: Chronic risk factors for suicide include: psychiatric disorder of anxiety. Acute risk factors for suicide include: N/A. Protective factors for this patient include: positive social support, responsibility to others (children, family), coping skills and hope for the future. Considering these factors, the overall suicide risk at this point appears to be low. Patient is appropriate for outpatient follow up.      Collaboration of Care: Collaboration of Care: Other reviewed notes in Epic  Patient/Guardian was advised Release of Information must be obtained prior to any record release in order to collaborate their care with an outside provider. Patient/Guardian was advised if they have not already done so to contact the registration department to sign all necessary forms in order for [place] to release information regarding their care.   Consent: Patient/Guardian gives verbal consent for treatment and assignment of benefits for services provided during this visit. Patient/Guardian expressed understanding and agreed to proceed.    Neysa Hotter, MD 04/26/2023, 10:00 AM

## 2023-04-26 ENCOUNTER — Encounter: Payer: Self-pay | Admitting: Psychiatry

## 2023-04-26 ENCOUNTER — Telehealth (INDEPENDENT_AMBULATORY_CARE_PROVIDER_SITE_OTHER): Payer: Medicaid Other | Admitting: Psychiatry

## 2023-04-26 DIAGNOSIS — G4700 Insomnia, unspecified: Secondary | ICD-10-CM

## 2023-04-26 DIAGNOSIS — F422 Mixed obsessional thoughts and acts: Secondary | ICD-10-CM

## 2023-04-26 DIAGNOSIS — F411 Generalized anxiety disorder: Secondary | ICD-10-CM

## 2023-04-26 DIAGNOSIS — F3341 Major depressive disorder, recurrent, in partial remission: Secondary | ICD-10-CM | POA: Diagnosis not present

## 2023-04-26 DIAGNOSIS — Z1329 Encounter for screening for other suspected endocrine disorder: Secondary | ICD-10-CM

## 2023-04-26 MED ORDER — ESCITALOPRAM OXALATE 20 MG PO TABS: 20.0000 mg | ORAL_TABLET | Freq: Every day | ORAL | 0 refills | Status: DC

## 2023-04-26 NOTE — Patient Instructions (Signed)
Continue lexapro 20 mg daily  Obtain labs (TSH) at [facility] Next appointment-  12/4 at 1 pm

## 2023-06-17 ENCOUNTER — Other Ambulatory Visit (HOSPITAL_COMMUNITY): Payer: Self-pay

## 2023-07-20 NOTE — Progress Notes (Unsigned)
Virtual Visit via Video Note  I connected with Sandra Dawson on 07/24/23 at  1:00 PM EST by a video enabled telemedicine application and verified that I am speaking with the correct person using two identifiers.  Location: Patient: home Provider: office Persons participated in the visit- patient, provider    I discussed the limitations of evaluation and management by telemedicine and the availability of in person appointments. The patient expressed understanding and agreed to proceed.     I discussed the assessment and treatment plan with the patient. The patient was provided an opportunity to ask questions and all were answered. The patient agreed with the plan and demonstrated an understanding of the instructions.   The patient was advised to call back or seek an in-person evaluation if the symptoms worsen or if the condition fails to improve as anticipated.  I provided 15 minutes of non-face-to-face time during this encounter.   Sandra Hotter, MD    [facility] MD/PA/NP OP Progress Note  07/24/2023 1:20 PM Sandra Dawson  MRN:  [id number]  Chief Complaint:  Chief Complaint  Patient presents with   Follow-up   HPI:  This is a follow-up appointment for depression, OCD, anxiety.  She states that she thinks her boss hates her.  It started after she had days off due to strep throat.  She was written up, and had a demotion.  She heard from her friends, who used to be a Radio broadcast assistant that she has been this way.  She is trying to find another job.  She has been in a relationship for the past 4 weeks.  He is more mature, and she feels comfortable with him.  She states that OCD has been perfectly fine except that she tends to wash her hands number when she is at the store.  She denies much concern when she is out from work.  She sleeps well.  She denies feeling depressed.  She denies change in appetite.  She denies SI.  She may drinks up to 3 alcohol per week.  She denies drug use.  She feels  comfortable to stay on the current medication.   Visit Diagnosis:    ICD-10-CM   1. Mixed obsessional thoughts and acts  F42.2     2. MDD (major depressive disorder), recurrent, in partial remission (HCC)  F33.41     3. Anxiety state  F41.1       Past Psychiatric History: Please see initial evaluation for full details. I have reviewed the history. No updates at this time.     Past Medical History:  Past Medical History:  Diagnosis Date   Acid reflux    ADHD (attention deficit hyperactivity disorder)    History of chicken pox     Past Surgical History:  Procedure Laterality Date   MYRINGOTOMY WITH TUBE PLACEMENT     NO PAST SURGERIES      Family Psychiatric History: Please see initial evaluation for full details. I have reviewed the history. No updates at this time.     Family History:  Family History  Problem Relation Age of Onset   Hyperlipidemia Father    Diabetes Father    ADD / ADHD Sister    Depression Sister    Eating disorder Sister    Pulmonary fibrosis Maternal Grandmother    Lung cancer Maternal Grandfather    Dementia Maternal Grandfather    Bladder Cancer Paternal Grandmother    Heart disease Paternal Grandfather    Heart attack Paternal  Grandfather    Healthy Mother     Social History:  Social History   Socioeconomic History   Marital status: Single    Spouse name: Not on file   Number of children: 0   Years of education: Not on file   Highest education level: Not on file  Occupational History   Occupation: Management consultant  Tobacco Use   Smoking status: Never   Smokeless tobacco: Never  Vaping Use   Vaping status: Never Used  Substance and Sexual Activity   Alcohol use: Yes    Alcohol/week: 3.0 standard drinks of alcohol    Types: 3 Cans of beer per week   Drug use: Never   Sexual activity: Yes    Birth control/protection: Pill  Other Topics Concern   Not on file  Social History Narrative   In college - Fish farm manager regularly   Social Determinants of Health   Financial Resource Strain: Not on file  Food Insecurity: Not on file  Transportation Needs: Not on file  Physical Activity: Not on file  Stress: Not on file  Social Connections: Unknown (04/24/2023)   Received from [employer]   Social Network    Social Network: Not on file    Allergies: No Known Allergies  Metabolic Disorder Labs: No results found for: "HGBA1C", "MPG" No results found for: "PROLACTIN" Lab Results  Component Value Date   CHOL 169 01/26/2014   TRIG 73.0 01/26/2014   HDL 62.00 01/26/2014   CHOLHDL 3 01/26/2014   VLDL 14.6 01/26/2014   LDLCALC 92 01/26/2014   Lab Results  Component Value Date   TSH 2.02 01/26/2014    Therapeutic Level Labs: No results found for: "LITHIUM" No results found for: "VALPROATE" No results found for: "CBMZ"  Current Medications: Current Outpatient Medications  Medication Sig Dispense Refill   [START ON 09/03/2023] escitalopram (LEXAPRO) 20 MG tablet Take 1 tablet (20 mg total) by mouth daily. 90 tablet 0   ipratropium (ATROVENT) 0.03 % nasal spray Place 2 sprays into both nostrils every 12 (twelve) hours. 30 mL 0   levonorgestrel-ethinyl estradiol (ALESSE) 0.1-20 MG-MCG tablet TAKE 1 TABLET BY MOUTH ONCE DAILY 84 tablet 4   lisdexamfetamine (VYVANSE) 20 MG capsule Take 1 capsule (20 mg total) by mouth daily. 30 capsule 0   Multiple Vitamin (MULTIVITAMIN) tablet Take 1 tablet by mouth daily.     pantoprazole (PROTONIX) 40 MG tablet TAKE 1 TABLET BY MOUTH EVERY DAY 30 tablet 0   No current facility-administered medications for this visit.     Musculoskeletal: Strength & Muscle Tone:  N/A Gait & Station:  N/A Patient leans: N/A  Psychiatric Specialty Exam: Review of Systems  Psychiatric/Behavioral:  Negative for agitation, behavioral problems, confusion, decreased concentration, dysphoric mood, hallucinations, self-injury, sleep disturbance and suicidal  ideas. The patient is nervous/anxious. The patient is not hyperactive.   All other systems reviewed and are negative.   There were no vitals taken for this visit.There is no height or weight on file to calculate BMI.  General Appearance: Well Groomed  Eye Contact:  Good  Speech:  Clear and Coherent  Volume:  Normal  Mood:   stressed  Affect:  Appropriate, Congruent, and Full Range  Thought Process:  Coherent  Orientation:  Full (Time, Place, and Person)  Thought Content: Logical   Suicidal Thoughts:  No  Homicidal Thoughts:  No  Memory:  Immediate;   Good  Judgement:  Good  Insight:  Good  Psychomotor Activity:  Normal  Concentration:  Concentration: Good and Attention Span: Good  Recall:  Good  Fund of Knowledge: Good  Language: Good  Akathisia:  No  Handed:  Right  AIMS (if indicated): not done  Assets:  Communication Skills Desire for Improvement  ADL's:  Intact  Cognition: WNL  Sleep:  Good   Screenings: PHQ2-9    Flowsheet Row Office Visit from 11/13/2021 in [facility] Video Visit from 06/09/2021 in [facility] Video Visit from 04/11/2021 in [facility] Office Visit from 12/07/2020 in [facility] Nutrition from 04/01/2014 in [facility] [place]  PHQ-2 Total Score 2 1 0 3 0  PHQ-9 Total Score 7 -- -- 9 --      Flowsheet Row Office Visit from 11/13/2021 in [facility] Video Visit from 06/09/2021 in [facility] Video Visit from 04/11/2021 in [facility]  C-SSRS RISK CATEGORY No Risk No Risk No Risk        Assessment and Plan:  Sandra Dawson is a 27 y.o. year old female with a history of OCD, anxiety, history of ADHD, who presents for  follow up appointment for below.   1. Mixed obsessional thoughts and acts 2. MDD (major depressive disorder), recurrent, in partial remission (HCC) 3. Anxiety state Acute stressors include: conflict with the store manager Other stressors include:  break up, loss of her great uncle   History:       Although she continues to have occasional OCD symptoms, these are manageable, and she has been handling things well despite stressors as above.  Will continue current dose of Lexapro to target OCD, depression and anxiety.  She was advised again to obtain lab to rule out medical health issues contributing to her symptoms.    4. Insomnia, unspecified type Overall improving.  She has middle insomnia and snoring.  She is working on weight loss.   Previously recommended for evaluation of sleep apnea.    # weight gain  Slightly improving. She attributes weight gain to chang in her diet in relation to stress.  Although the hope was for her to be evaluated by her PCP, she has not been able to establish the care.  Will obtain lab to rule out medical health issues contributing to her symptoms.     # history of ADHD Unchanged. She sees [place] and attentional specialist.      Plan Continue lexapro 20 mg daily - monitor acne  Obtain labs (TSH) - [facility] Next appointment-  2/12 at 1 40 for 20 mins, video - on Vyvanse 20 mg daily (worsening in OCD at higher dose)   Past trials of medication: sertraline,    The patient demonstrates the following risk factors for suicide: Chronic risk factors for suicide include: psychiatric disorder of anxiety. Acute risk factors for suicide include: N/A. Protective factors for this patient include: positive social support, responsibility to others (children, family), coping skills and hope for the future. Considering these factors, the overall suicide risk at this point appears to be low. Patient is appropriate for outpatient follow up.    Collaboration of Care:  Collaboration of Care: Other reviewed notes in Epic  Patient/Guardian was advised Release of Information must be obtained prior to any record release in order to  collaborate their care with an outside provider. Patient/Guardian was advised if they have not already done so to contact the registration department to sign all necessary forms in order for [place] to release information regarding their care.   Consent: Patient/Guardian gives verbal consent for treatment and assignment of benefits for services provided during this visit. Patient/Guardian expressed understanding and agreed to proceed.    Sandra Hotter, MD 07/24/2023, 1:20 PM

## 2023-07-24 ENCOUNTER — Telehealth: Payer: Self-pay | Admitting: Psychiatry

## 2023-07-24 ENCOUNTER — Encounter: Payer: Self-pay | Admitting: Psychiatry

## 2023-07-24 DIAGNOSIS — F3341 Major depressive disorder, recurrent, in partial remission: Secondary | ICD-10-CM

## 2023-07-24 DIAGNOSIS — F422 Mixed obsessional thoughts and acts: Secondary | ICD-10-CM

## 2023-07-24 DIAGNOSIS — F411 Generalized anxiety disorder: Secondary | ICD-10-CM

## 2023-07-24 MED ORDER — ESCITALOPRAM OXALATE 20 MG PO TABS: 20.0000 mg | ORAL_TABLET | Freq: Every day | ORAL | 0 refills | Status: DC

## 2023-07-24 NOTE — Patient Instructions (Signed)
Continue lexapro 20 mg daily  Obtain labs (TSH) - [facility] Next appointment-  2/12 at 1 40

## 2023-09-28 NOTE — Progress Notes (Deleted)
 [facility] MD/PA/NP OP Progress Note  09/28/2023 4:32 PM Sandra Dawson  MRN:  [id number]  Chief Complaint: No chief complaint on file.  HPI: *** Visit Diagnosis: No diagnosis found.  Past Psychiatric History: Please see initial evaluation for full details. I have reviewed the history. No updates at this time.     Past Medical History:  Past Medical History:  Diagnosis Date   Acid reflux    ADHD (attention deficit hyperactivity disorder)    History of chicken pox     Past Surgical History:  Procedure Laterality Date   MYRINGOTOMY WITH TUBE PLACEMENT     NO PAST SURGERIES      Family Psychiatric History: Please see initial evaluation for full details. I have reviewed the history. No updates at this time.     Family History:  Family History  Problem Relation Age of Onset   Hyperlipidemia Father    Diabetes Father    ADD / ADHD Sister    Depression Sister    Eating disorder Sister    Pulmonary fibrosis Maternal Grandmother    Lung cancer Maternal Grandfather    Dementia Maternal Grandfather    Bladder Cancer Paternal Grandmother    Heart disease Paternal Grandfather    Heart attack Paternal Grandfather    Healthy Mother     Social History:  Social History   Socioeconomic History   Marital status: Single    Spouse name: Not on file   Number of children: 0   Years of education: Not on file   Highest education level: Not on file  Occupational History   Occupation: Management consultant  Tobacco Use   Smoking status: Never   Smokeless tobacco: Never  Vaping Use   Vaping status: Never Used  Substance and Sexual Activity   Alcohol use: Yes    Alcohol/week: 3.0 standard drinks of alcohol    Types: 3 Cans of beer per week   Drug use: Never   Sexual activity: Yes    Birth control/protection: Pill  Other Topics Concern   Not on file  Social History Narrative   In college - Production designer, theatre/television/film regularly   Social Drivers of Health    Financial Resource Strain: Not on file  Food Insecurity: Not on file  Transportation Needs: Not on file  Physical Activity: Not on file  Stress: Not on file  Social Connections: Unknown (04/24/2023)   Received from [employer]   Social Network    Social Network: Not on file    Allergies: No Known Allergies  Metabolic Disorder Labs: No results found for: "HGBA1C", "MPG" No results found for: "PROLACTIN" Lab Results  Component Value Date   CHOL 169 01/26/2014   TRIG 73.0 01/26/2014   HDL 62.00 01/26/2014   CHOLHDL 3 01/26/2014   VLDL 14.6 01/26/2014   LDLCALC 92 01/26/2014   Lab Results  Component Value Date   TSH 2.02 01/26/2014    Therapeutic Level Labs: No results found for: "LITHIUM" No results found for: "VALPROATE" No results found for: "CBMZ"  Current Medications: Current Outpatient Medications  Medication Sig Dispense Refill   escitalopram (LEXAPRO) 20 MG tablet Take 1 tablet (20 mg total) by mouth daily. 90 tablet 0   ipratropium (ATROVENT) 0.03 % nasal spray Place 2 sprays into both nostrils every 12 (twelve) hours. 30 mL 0   levonorgestrel-ethinyl estradiol (ALESSE) 0.1-20 MG-MCG tablet TAKE 1 TABLET BY MOUTH ONCE DAILY 84 tablet 4   lisdexamfetamine (VYVANSE)  20 MG capsule Take 1 capsule (20 mg total) by mouth daily. 30 capsule 0   Multiple Vitamin (MULTIVITAMIN) tablet Take 1 tablet by mouth daily.     pantoprazole (PROTONIX) 40 MG tablet TAKE 1 TABLET BY MOUTH EVERY DAY 30 tablet 0   No current facility-administered medications for this visit.     Musculoskeletal: Strength & Muscle Tone:  N/A Gait & Station:  N/A Patient leans: N/A  Psychiatric Specialty Exam: Review of Systems  There were no vitals taken for this visit.There is no height or weight on file to calculate BMI.  General Appearance: {Appearance:22683}  Eye Contact:  {BHH EYE CONTACT:22684}  Speech:  Clear and Coherent  Volume:  Normal  Mood:  {BHH MOOD:22306}  Affect:  {Affect  (PAA):22687}  Thought Process:  Coherent  Orientation:  Full (Time, Place, and Person)  Thought Content: Logical   Suicidal Thoughts:  {ST/HT (PAA):22692}  Homicidal Thoughts:  {ST/HT (PAA):22692}  Memory:  Immediate;   Good  Judgement:  {Judgement (PAA):22694}  Insight:  {Insight (PAA):22695}  Psychomotor Activity:  Normal  Concentration:  Concentration: Good and Attention Span: Good  Recall:  Good  Fund of Knowledge: Good  Language: Good  Akathisia:  No  Handed:  Right  AIMS (if indicated): not done  Assets:  Communication Skills Desire for Improvement  ADL's:  Intact  Cognition: WNL  Sleep:  {BHH GOOD/FAIR/POOR:22877}   Screenings: [employer] [facility] Office Visit from 11/13/2021 in [facility] Video Visit from 06/09/2021 in [facility] Video Visit from 04/11/2021 in [facility] Office Visit from 12/07/2020 in [facility] Nutrition from 04/01/2014 in [facility]  - A Dept Of [place]. [facility]  PHQ-2 Total Score 2 1 0 3 0  PHQ-9 Total Score 7 -- -- 9 --      Flowsheet Row Office Visit from 11/13/2021 in [facility] Video Visit from 06/09/2021 in [facility] Video Visit from 04/11/2021 in [facility]  C-SSRS RISK CATEGORY No Risk No Risk No Risk        Assessment and Plan:  Sandra Dawson is a 28 y.o. year old female with a history of OCD, anxiety, history of ADHD, who presents for follow up appointment for below.    1. Mixed obsessional thoughts and acts 2. MDD (major depressive disorder), recurrent, in partial remission (HCC) 3. Anxiety state Acute stressors include: conflict with the store manager Other stressors include:  break up, loss of her great uncle    History:       Although she continues to have occasional OCD symptoms, these are manageable, and she has been handling things well despite stressors as above.  Will continue current dose of Lexapro to target OCD, depression and anxiety.  She was advised again to obtain lab to rule out medical health issues contributing to her symptoms.    4. Insomnia, unspecified type Overall improving.  She has middle insomnia and snoring.  She is working on weight loss.   Previously recommended for evaluation of sleep apnea.    # weight gain  Slightly improving. She attributes weight gain to chang in her diet in relation to stress.  Although the hope was for her to be evaluated by her PCP, she has not been able to establish the care.  Will obtain  lab to rule out medical health issues contributing to her symptoms.     # history of ADHD Unchanged. She sees [place] and attentional specialist.      Plan Continue lexapro 20 mg daily - monitor acne  Obtain labs (TSH) - [facility] Next appointment-  2/12 at 1 40 for 20 mins, video - on Vyvanse 20 mg daily (worsening in OCD at higher dose)   Past trials of medication: sertraline,    The patient demonstrates the following risk factors for suicide: Chronic risk factors for suicide include: psychiatric disorder of anxiety. Acute risk factors for suicide include: N/A. Protective factors for this patient include: positive social support, responsibility to others (children, family), coping skills and hope for the future. Considering these factors, the overall suicide risk at this point appears to be low. Patient is appropriate for outpatient follow up.  Collaboration of Care: Collaboration of Care: {BH OP Collaboration of Care:21014065}  Patient/Guardian was advised Release of Information must be obtained prior to any record release in order to collaborate their care with an outside provider. Patient/Guardian was advised if they have not already done so to contact the  registration department to sign all necessary forms in order for [place] to release information regarding their care.   Consent: Patient/Guardian gives verbal consent for treatment and assignment of benefits for services provided during this visit. Patient/Guardian expressed understanding and agreed to proceed.    Neysa Hotter, MD 09/28/2023, 4:32 PM

## 2023-10-02 ENCOUNTER — Telehealth: Payer: Medicaid Other | Admitting: Psychiatry

## 2023-10-21 ENCOUNTER — Other Ambulatory Visit: Payer: Self-pay | Admitting: Psychiatry

## 2023-10-25 ENCOUNTER — Other Ambulatory Visit: Payer: Self-pay | Admitting: Psychiatry

## 2023-11-01 NOTE — Progress Notes (Signed)
 Virtual Visit via Video Note  I connected with Sandra Dawson on 11/04/23 at  9:30 AM EDT by a video enabled telemedicine application and verified that I am speaking with the correct person using two identifiers.  Location: Patient: home Provider: office Persons participated in the visit- patient, provider    I discussed the limitations of evaluation and management by telemedicine and the availability of in person appointments. The patient expressed understanding and agreed to proceed.   I discussed the assessment and treatment plan with the patient. The patient was provided an opportunity to ask questions and all were answered. The patient agreed with the plan and demonstrated an understanding of the instructions.   The patient was advised to call back or seek an in-person evaluation if the symptoms worsen or if the condition fails to improve as anticipated.   Neysa Hotter, MD    [facility] MD/PA/NP OP Progress Note  11/04/2023 10:01 AM Sandra Dawson  MRN:  [id number]  Chief Complaint:  Chief Complaint  Patient presents with   Follow-up   HPI:  This is a follow-up appointment for depression, OCD.  She states that she has started a new job at [employer].  It has good benefit.  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  Although she still feels on edge at times due to experiencing the last job, she has been feeling better.  She is hoping to have higher  paying salary job.  She reports good relationship with her boyfriend of 4 months, who she has known through dating app.  She enjoys going out with him or seeing her great friend.  However, she feels exhausted, and her laundry has been piling up.  Her mother would come today to do deep cleaning as she usually feels better that way.  She had 1 panic attack, although she cannot recall the details.  She has been sleeping better.  She has good appetite, and is maintaining weight.  She reports difficulty in concentration since she  could not find the Vyvanse.  She agrees to feel the medication if she is unable to find one.  She denies SI.  She drinks a few times per week.  She denies drug use.   Wt Readings from Last 3 Encounters:  11/13/21 164 lb 12.8 oz (74.8 kg)  09/29/20 147 lb (66.7 kg)  07/22/19 145 lb 2 oz (65.8 kg)     Visit Diagnosis:    ICD-10-CM   1. Mixed obsessional thoughts and acts  F42.2     2. MDD (major depressive disorder), recurrent, in partial remission (HCC)  F33.41 TSH    3. Anxiety state  F41.1     4. Fatigue, unspecified type  R53.83 CBC    Ferritin    5. Screening, anemia, deficiency, iron  Z13.0 CBC    Ferritin      Past Psychiatric History: Please see initial evaluation for full details. I have reviewed the history. No updates at this time.     Past Medical History:  Past Medical History:  Diagnosis Date   Acid reflux    ADHD (attention deficit hyperactivity disorder)    History of chicken pox     Past Surgical History:  Procedure Laterality Date   MYRINGOTOMY WITH TUBE PLACEMENT     NO PAST SURGERIES      Family Psychiatric History: Please see initial evaluation for full details. I have reviewed the history. No updates at this time.  Family History:  Family History  Problem Relation Age of Onset   Hyperlipidemia Father    Diabetes Father    ADD / ADHD Sister    Depression Sister    Eating disorder Sister    Pulmonary fibrosis Maternal Grandmother    Lung cancer Maternal Grandfather    Dementia Maternal Grandfather    Bladder Cancer Paternal Grandmother    Heart disease Paternal Grandfather    Heart attack Paternal Grandfather    Healthy Mother     Social History:  Social History   Socioeconomic History   Marital status: Single    Spouse name: Not on file   Number of children: 0   Years of education: Not on file   Highest education level: Not on file  Occupational History   Occupation: Management consultant  Tobacco Use   Smoking status:  Never   Smokeless tobacco: Never  Vaping Use   Vaping status: Never Used  Substance and Sexual Activity   Alcohol use: Yes    Alcohol/week: 3.0 standard drinks of alcohol    Types: 3 Cans of beer per week   Drug use: Never   Sexual activity: Yes    Birth control/protection: Pill  Other Topics Concern   Not on file  Social History Narrative   In college - Production designer, theatre/television/film regularly   Social Drivers of Health   Financial Resource Strain: Not on file  Food Insecurity: Not on file  Transportation Needs: Not on file  Physical Activity: Not on file  Stress: Not on file  Social Connections: Unknown (04/24/2023)   Received from [employer]   Social Network    Social Network: Not on file    Allergies: No Known Allergies  Metabolic Disorder Labs: No results found for: "HGBA1C", "MPG" No results found for: "PROLACTIN" Lab Results  Component Value Date   CHOL 169 01/26/2014   TRIG 73.0 01/26/2014   HDL 62.00 01/26/2014   CHOLHDL 3 01/26/2014   VLDL 14.6 01/26/2014   LDLCALC 92 01/26/2014   Lab Results  Component Value Date   TSH 2.02 01/26/2014    Therapeutic Level Labs: No results found for: "LITHIUM" No results found for: "VALPROATE" No results found for: "CBMZ"  Current Medications: Current Outpatient Medications  Medication Sig Dispense Refill   escitalopram (LEXAPRO) 20 MG tablet Take 1 tablet (20 mg total) by mouth daily. 30 tablet 0   ipratropium (ATROVENT) 0.03 % nasal spray Place 2 sprays into both nostrils every 12 (twelve) hours. 30 mL 0   levonorgestrel-ethinyl estradiol (ALESSE) 0.1-20 MG-MCG tablet TAKE 1 TABLET BY MOUTH ONCE DAILY 84 tablet 4   lisdexamfetamine (VYVANSE) 20 MG capsule Take 1 capsule (20 mg total) by mouth daily. 30 capsule 0   Multiple Vitamin (MULTIVITAMIN) tablet Take 1 tablet by mouth daily.     pantoprazole (PROTONIX) 40 MG tablet TAKE 1 TABLET BY MOUTH EVERY DAY 30 tablet 0   No current  facility-administered medications for this visit.     Musculoskeletal: Strength & Muscle Tone:  N/A Gait & Station:  N/A Patient leans: N/A  Psychiatric Specialty Exam: Review of Systems  Psychiatric/Behavioral:  Negative for agitation, behavioral problems, confusion, decreased concentration, dysphoric mood, hallucinations, self-injury, sleep disturbance and suicidal ideas. The patient is nervous/anxious. The patient is not hyperactive.   All other systems reviewed and are negative.   There were no vitals taken for this visit.There is no height or weight on file to calculate  BMI.  General Appearance: Well Groomed  Eye Contact:  Good  Speech:  Clear and Coherent  Volume:  Normal  Mood:   better  Affect:  Appropriate, Congruent, and Full Range  Thought Process:  Coherent  Orientation:  Full (Time, Place, and Person)  Thought Content: Logical   Suicidal Thoughts:  No  Homicidal Thoughts:  No  Memory:  Immediate;   Good  Judgement:  Good  Insight:  Good  Psychomotor Activity:  Normal  Concentration:  Concentration: Good and Attention Span: Good  Recall:  Good  Fund of Knowledge: Good  Language: Good  Akathisia:  No  Handed:  Right  AIMS (if indicated): not done  Assets:  Communication Skills Desire for Improvement  ADL's:  Intact  Cognition: WNL  Sleep:  Fair   Screenings: Equities trader Office Visit from 11/13/2021 in [facility] Video Visit from 06/09/2021 in [facility] Video Visit from 04/11/2021 in [facility] Office Visit from 12/07/2020 in [facility] Nutrition from 04/01/2014 in [facility]  - A Dept Of [place]. [facility]  PHQ-2 Total Score 2 1 0 3 0  PHQ-9 Total Score 7 -- -- 9 --      Flowsheet Row Office Visit from 11/13/2021 in [facility] Video Visit from 06/09/2021 in [facility] Video Visit from 04/11/2021 in [facility]  C-SSRS RISK CATEGORY No Risk No Risk No Risk        Assessment and Plan:  LIELLE VANDERVORT is a 28 y.o. year old female with a history of OCD, anxiety, history of ADHD, who presents for follow up appointment for below.    1. Mixed obsessional thoughts and acts 2. MDD (major depressive disorder), recurrent, in partial remission (HCC) 3. Anxiety state Acute stressors include: starting a new work Other stressors include:  previous work environment, loss of her great uncle   History:      Although she had 1 panic attack, which she could not recall details, there has been overall improvement in depressive symptoms, anxiety, and OCD symptoms.  Will continue current dose of Lexapro to target OCD, depression and anxiety.    4. Fatigue, unspecified type Newly addressed.  She reports slight worsening in fatigue.  Given she has not been able to establish care with primary care, we will obtain lab to rule out medical health issues contributing to the symptoms.    4. Insomnia, unspecified type Overall improving,   She has middle insomnia and snoring.  She is working on weight loss.   Previously recommended for evaluation of sleep apnea.    # history of ADHD Slightly worsening in the setting of her losing Vyvanse.  She sees [place] attention specialist.    Plan Continue lexapro 20 mg daily  Obtain labs (TSH, CBC, ferritin) - [facility] Next appointment-  2/12 at 1 40 for 20 mins, video - on Vyvanse 20 mg daily (worsening in OCD at higher dose)   Past trials of medication: sertraline,    The patient demonstrates the following risk factors for suicide: Chronic risk factors for suicide include: psychiatric disorder of anxiety. Acute risk factors for suicide include: N/A. Protective factors for this patient  include: positive social support, responsibility to others (children, family), coping skills and hope for the future. Considering these factors,  the overall suicide risk at this point appears to be low. Patient is appropriate for outpatient follow up.    Collaboration of Care: Collaboration of Care: Other reviewed notes in Epic  Patient/Guardian was advised Release of Information must be obtained prior to any record release in order to collaborate their care with an outside provider. Patient/Guardian was advised if they have not already done so to contact the registration department to sign all necessary forms in order for [place] to release information regarding their care.   Consent: Patient/Guardian gives verbal consent for treatment and assignment of benefits for services provided during this visit. Patient/Guardian expressed understanding and agreed to proceed.    Neysa Hotter, MD 11/04/2023, 10:01 AM

## 2023-11-04 ENCOUNTER — Telehealth: Payer: Medicaid Other | Admitting: Psychiatry

## 2023-11-04 ENCOUNTER — Encounter: Payer: Self-pay | Admitting: Psychiatry

## 2023-11-04 DIAGNOSIS — F3341 Major depressive disorder, recurrent, in partial remission: Secondary | ICD-10-CM

## 2023-11-04 DIAGNOSIS — F411 Generalized anxiety disorder: Secondary | ICD-10-CM | POA: Diagnosis not present

## 2023-11-04 DIAGNOSIS — Z130 Encounter for screening for diseases of the blood and blood-forming organs and certain disorders involving the immune mechanism: Secondary | ICD-10-CM

## 2023-11-04 DIAGNOSIS — F422 Mixed obsessional thoughts and acts: Secondary | ICD-10-CM

## 2023-11-04 DIAGNOSIS — R5383 Other fatigue: Secondary | ICD-10-CM

## 2023-12-14 NOTE — Progress Notes (Signed)
 Virtual Visit via Video Note  I connected with Sandra Dawson on 12/20/23 at  8:20 AM EDT by a video enabled telemedicine application and verified that I am speaking with the correct person using two identifiers.  Location: Patient: home Provider: home office Persons participated in the visit- patient, provider    I discussed the limitations of evaluation and management by telemedicine and the availability of in person appointments. The patient expressed understanding and agreed to proceed.    I discussed the assessment and treatment plan with the patient. The patient was provided an opportunity to ask questions and all were answered. The patient agreed with the plan and demonstrated an understanding of the instructions.   The patient was advised to call back or seek an in-person evaluation if the symptoms worsen or if the condition fails to improve as anticipated.   Todd Fossa, MD    [facility] MD/PA/NP OP Progress Note  12/20/2023 8:42 AM Sandra Dawson  MRN:  [id number]  Chief Complaint:  Chief Complaint  Patient presents with   Follow-up   HPI:  This is a follow-up appointment for OCD, depression and anxiety.  She states that she is now doing a full-time work at [employer].  She feels good about this as she also has benefit.  She had a good Easter with her boyfriend and her parents.  It went well.  She has been feeling tired from work.  Her mood has been up and down, working so many hours.  She also reports her parents, who is still treating her like a child.  She is trying to have boundary.  Her boyfriend has been very helpful and supportive.  She is using the app for goes for the day, and it has been working.  She sleeps well.  She feels tired at times.  She denies anxiety except she takes additional espresso.  Her OCD symptoms like taking a shower has been manageable.  She denies panic attacks.  She denies SI.  She drinks only a few times per week.  She denies drug use.  She  agrees with the following.   Visit Diagnosis:    ICD-10-CM   1. Mixed obsessional thoughts and acts  F42.2     2. MDD (major depressive disorder), recurrent, in partial remission (HCC)  F33.41     3. Anxiety state  F41.1     4. Fatigue, unspecified type  R53.83       Past Psychiatric History: Please see initial evaluation for full details. I have reviewed the history. No updates at this time.     Past Medical History:  Past Medical History:  Diagnosis Date   Acid reflux    ADHD (attention deficit hyperactivity disorder)    History of chicken pox     Past Surgical History:  Procedure Laterality Date   MYRINGOTOMY WITH TUBE PLACEMENT     NO PAST SURGERIES      Family Psychiatric History: Please see initial evaluation for full details. I have reviewed the history. No updates at this time.     Family History:  Family History  Problem Relation Age of Onset   Hyperlipidemia Father    Diabetes Father    ADD / ADHD Sister    Depression Sister    Eating disorder Sister    Pulmonary fibrosis Maternal Grandmother    Lung cancer Maternal Grandfather    Dementia Maternal Grandfather    Bladder Cancer Paternal Grandmother    Heart disease  Paternal Grandfather    Heart attack Paternal Grandfather    Healthy Mother     Social History:  Social History   Socioeconomic History   Marital status: Single    Spouse name: Not on file   Number of children: 0   Years of education: Not on file   Highest education level: Not on file  Occupational History   Occupation: Management consultant  Tobacco Use   Smoking status: Never   Smokeless tobacco: Never  Vaping Use   Vaping status: Never Used  Substance and Sexual Activity   Alcohol use: Yes    Alcohol/week: 3.0 standard drinks of alcohol    Types: 3 Cans of beer per week   Drug use: Never   Sexual activity: Yes    Birth control/protection: Pill  Other Topics Concern   Not on file  Social History Narrative   In  college - Production designer, theatre/television/film regularly   Social Drivers of Health   Financial Resource Strain: Not on file  Food Insecurity: Not on file  Transportation Needs: Not on file  Physical Activity: Not on file  Stress: Not on file  Social Connections: Unknown (04/24/2023)   Received from [employer]   Social Network    Social Network: Not on file    Allergies: No Known Allergies  Metabolic Disorder Labs: No results found for: "HGBA1C", "MPG" No results found for: "PROLACTIN" Lab Results  Component Value Date   CHOL 169 01/26/2014   TRIG 73.0 01/26/2014   HDL 62.00 01/26/2014   CHOLHDL 3 01/26/2014   VLDL 14.6 01/26/2014   LDLCALC 92 01/26/2014   Lab Results  Component Value Date   TSH 2.02 01/26/2014    Therapeutic Level Labs: No results found for: "LITHIUM" No results found for: "VALPROATE" No results found for: "CBMZ"  Current Medications: Current Outpatient Medications  Medication Sig Dispense Refill   [START ON 01/15/2024] escitalopram  (LEXAPRO ) 20 MG tablet Take 1 tablet (20 mg total) by mouth daily. 30 tablet 0   ipratropium (ATROVENT ) 0.03 % nasal spray Place 2 sprays into both nostrils every 12 (twelve) hours. 30 mL 0   levonorgestrel -ethinyl estradiol  (ALESSE) 0.1-20 MG-MCG tablet TAKE 1 TABLET BY MOUTH ONCE DAILY 84 tablet 4   lisdexamfetamine (VYVANSE ) 20 MG capsule Take 1 capsule (20 mg total) by mouth daily. 30 capsule 0   Multiple Vitamin (MULTIVITAMIN) tablet Take 1 tablet by mouth daily.     pantoprazole  (PROTONIX ) 40 MG tablet TAKE 1 TABLET BY MOUTH EVERY DAY 30 tablet 0   No current facility-administered medications for this visit.     Musculoskeletal: Strength & Muscle Tone:  N/A Gait & Station:  N/A Patient leans: N/A  Psychiatric Specialty Exam: Review of Systems  Psychiatric/Behavioral:  Positive for dysphoric mood. Negative for agitation, behavioral problems, confusion, decreased concentration, hallucinations,  self-injury, sleep disturbance and suicidal ideas. The patient is not nervous/anxious and is not hyperactive.   All other systems reviewed and are negative.   There were no vitals taken for this visit.There is no height or weight on file to calculate BMI.  General Appearance: Well Groomed  Eye Contact:  Good  Speech:  Clear and Coherent  Volume:  Normal  Mood:   up and down  Affect:  Appropriate, Congruent, and fatigue  Thought Process:  Coherent  Orientation:  Full (Time, Place, and Person)  Thought Content: Logical   Suicidal Thoughts:  No  Homicidal Thoughts:  No  Memory:  Immediate;   Good  Judgement:  Good  Insight:  Good  Psychomotor Activity:  Normal  Concentration:  Concentration: Good and Attention Span: Good  Recall:  Good  Fund of Knowledge: Good  Language: Good  Akathisia:  No  Handed:  Right  AIMS (if indicated): not done  Assets:  Communication Skills Desire for Improvement  ADL's:  Intact  Cognition: WNL  Sleep:  Fair   Screenings: Equities trader Office Visit from 11/13/2021 in [facility] Video Visit from 06/09/2021 in [facility] Video Visit from 04/11/2021 in [facility] Office Visit from 12/07/2020 in [facility] Nutrition from 04/01/2014 in [facility]  - A Dept Of [place]. [facility]  PHQ-2 Total Score 2 1 0 3 0  PHQ-9 Total Score 7 -- -- 9 --      Flowsheet Row Office Visit from 11/13/2021 in [facility] Video Visit from 06/09/2021 in [facility] Video Visit from 04/11/2021 in [facility]  C-SSRS RISK CATEGORY No Risk No Risk No Risk        Assessment and Plan:  TRESURE BARLETTA is a 28 y.o. year old female with a history of OCD,  anxiety, history of ADHD, who presents for follow up appointment for below.    1. Mixed obsessional thoughts and acts 2. MDD (major depressive disorder), recurrent, in partial remission (HCC) 3. Anxiety state Acute stressors include: starting a full time work Other stressors include:  previous work environment, loss of her great uncle   History:      Although she reports occasional down mood in the context of conflict with her parents, she has been able to set boundaries, and reports good support from her boyfriend.  She denies any significant OCD symptoms or anxiety.  Will continue current dose of Lexapro  to target OCD, depression and anxiety.   4. Fatigue, unspecified type Unchanged.  She agrees to obtain labs to rule out medical health issues contributing to her symptoms.  She is in the process of establishing care with primary care.    4. Insomnia, unspecified type Overall improving,   She has middle insomnia and snoring.  She is working on weight loss.   Previously recommended for evaluation of sleep apnea.    # history of ADHD Slightly worsening in the setting of her losing Vyvanse .  She sees [place] attention specialist.    Plan Continue lexapro  20 mg daily  Obtain labs (TSH, CBC, ferritin) - [facility] Next appointment- 6/27 at 8 am, video - on Vyvanse  20 mg daily (worsening in OCD at higher dose)   Past trials of medication: sertraline ,    The patient demonstrates the following risk factors for suicide: Chronic risk factors for suicide include: psychiatric disorder of anxiety. Acute risk factors for suicide include: N/A. Protective factors for this patient include: positive social support, responsibility to others (children, family), coping skills and hope for the future. Considering these factors, the overall suicide risk at this point appears to be low. Patient is appropriate for outpatient follow up.    Collaboration of Care: Collaboration of Care: Other reviewed notes in  Epic  Patient/Guardian was advised Release of Information must be obtained prior to any record release in order to collaborate their care with an outside provider. Patient/Guardian was advised if they have  not already done so to contact the registration department to sign all necessary forms in order for us  to release information regarding their care.   Consent: Patient/Guardian gives verbal consent for treatment and assignment of benefits for services provided during this visit. Patient/Guardian expressed understanding and agreed to proceed.    Todd Fossa, MD 12/20/2023, 8:42 AM

## 2023-12-16 ENCOUNTER — Other Ambulatory Visit: Payer: Self-pay | Admitting: Internal Medicine

## 2023-12-16 ENCOUNTER — Other Ambulatory Visit: Payer: Self-pay | Admitting: Psychiatry

## 2023-12-20 ENCOUNTER — Telehealth (INDEPENDENT_AMBULATORY_CARE_PROVIDER_SITE_OTHER): Admitting: Psychiatry

## 2023-12-20 ENCOUNTER — Encounter: Payer: Self-pay | Admitting: Psychiatry

## 2023-12-20 DIAGNOSIS — G4700 Insomnia, unspecified: Secondary | ICD-10-CM

## 2023-12-20 DIAGNOSIS — R5383 Other fatigue: Secondary | ICD-10-CM

## 2023-12-20 DIAGNOSIS — F411 Generalized anxiety disorder: Secondary | ICD-10-CM | POA: Diagnosis not present

## 2023-12-20 DIAGNOSIS — F3341 Major depressive disorder, recurrent, in partial remission: Secondary | ICD-10-CM

## 2023-12-20 DIAGNOSIS — F422 Mixed obsessional thoughts and acts: Secondary | ICD-10-CM

## 2023-12-20 MED ORDER — ESCITALOPRAM OXALATE 20 MG PO TABS: 20.0000 mg | ORAL_TABLET | Freq: Every day | ORAL | 0 refills | Status: DC

## 2023-12-20 NOTE — Patient Instructions (Signed)
 Continue lexapro  20 mg daily  Obtain labs (TSH, CBC, ferritin)  Next appointment- 6/27 at 8 am

## 2024-02-08 NOTE — Progress Notes (Signed)
 Virtual Visit via Video Note  I connected with Sandra Dawson on 02/14/24 at  8:00 AM EDT by a video enabled telemedicine application and verified that I am speaking with the correct person using two identifiers.  Location: Patient: home Provider: home office Persons participated in the visit- patient, provider    I discussed the limitations of evaluation and management by telemedicine and the availability of in person appointments. The patient expressed understanding and agreed to proceed.    I discussed the assessment and treatment plan with the patient. The patient was provided an opportunity to ask questions and all were answered. The patient agreed with the plan and demonstrated an understanding of the instructions.   The patient was advised to call back or seek an in-person evaluation if the symptoms worsen or if the condition fails to improve as anticipated.  Katheren Sleet, MD    [facility] MD/PA/NP OP Progress Note  02/14/2024 8:24 AM Sandra Dawson  MRN:  [id number]  Chief Complaint:  Chief Complaint  Patient presents with   Follow-up   HPI:  This is a follow-up appointment for OCD, depression and anxiety.  She states that she is applying for a job.  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  The work has been going well.  She reports good relationship with her boyfriend.  She feels she has a good support system including him and her friends.  It takes 15 minutes along less to take showers, and she denies much of OCD symptoms.  She denies feeling depressed.  She denies feeling anxious.  She denies SI, HI, hallucinations.  She drinks only occasionally, and denies binge drinking or craving for alcohol.  She denies drug use.  She has not been able to see primary care, partly because she is concerned about the co-pay.  She has not measured weight as she does not want to feel depressed about this.  She has been trying to work on the portion of her diet.  It  has been difficult due to exercise due to workload. She reports racing mind, which she attributes to ADHD.  She has not been seen by [facility] lately.  she agrees with the plans as outlined below.   Weight 80.3 kg (177 lb) 06/07/2023 10:40 AM EDT     Wt Readings from Last 3 Encounters:  11/13/21 164 lb 12.8 oz (74.8 kg)  09/29/20 147 lb (66.7 kg)  07/22/19 145 lb 2 oz (65.8 kg)    Blood Pressure 120/85 06/07/2023 10:40 AM EDT    Visit Diagnosis:    ICD-10-CM   1. Mixed obsessional thoughts and acts  F42.2     2. MDD (major depressive disorder), recurrent, in partial remission (HCC)  F33.41     3. Anxiety state  F41.1       Past Psychiatric History: Please see initial evaluation for full details. I have reviewed the history. No updates at this time.     Past Medical History:  Past Medical History:  Diagnosis Date   Acid reflux    ADHD (attention deficit hyperactivity disorder)    History of chicken pox     Past Surgical History:  Procedure Laterality Date   MYRINGOTOMY WITH TUBE PLACEMENT     NO PAST SURGERIES      Family Psychiatric History: Please see initial evaluation for full details. I have reviewed the history. No updates at this time.     Family History:  Family  History  Problem Relation Age of Onset   Hyperlipidemia Father    Diabetes Father    ADD / ADHD Sister    Depression Sister    Eating disorder Sister    Pulmonary fibrosis Maternal Grandmother    Lung cancer Maternal Grandfather    Dementia Maternal Grandfather    Bladder Cancer Paternal Grandmother    Heart disease Paternal Grandfather    Heart attack Paternal Grandfather    Healthy Mother     Social History:  Social History   Socioeconomic History   Marital status: Single    Spouse name: Not on file   Number of children: 0   Years of education: Not on file   Highest education level: Not on file  Occupational History   Occupation: Management consultant   Tobacco Use   Smoking status: Never   Smokeless tobacco: Never  Vaping Use   Vaping status: Never Used  Substance and Sexual Activity   Alcohol use: Yes    Alcohol/week: 3.0 standard drinks of alcohol    Types: 3 Cans of beer per week   Drug use: Never   Sexual activity: Yes    Birth control/protection: Pill  Other Topics Concern   Not on file  Social History Narrative   In college - Production designer, theatre/television/film regularly   Social Drivers of Health   Financial Resource Strain: Not on file  Food Insecurity: Not on file  Transportation Needs: Not on file  Physical Activity: Not on file  Stress: Not on file  Social Connections: Unknown (04/24/2023)   Received from [employer]   Social Network    Social Network: Not on file    Allergies: No Known Allergies  Metabolic Disorder Labs: No results found for: HGBA1C, MPG No results found for: PROLACTIN Lab Results  Component Value Date   CHOL 169 01/26/2014   TRIG 73.0 01/26/2014   HDL 62.00 01/26/2014   CHOLHDL 3 01/26/2014   VLDL 14.6 01/26/2014   LDLCALC 92 01/26/2014   Lab Results  Component Value Date   TSH 2.02 01/26/2014    Therapeutic Level Labs: No results found for: LITHIUM No results found for: VALPROATE No results found for: CBMZ  Current Medications: Current Outpatient Medications  Medication Sig Dispense Refill   escitalopram  (LEXAPRO ) 20 MG tablet Take 1 tablet (20 mg total) by mouth daily. 30 tablet 1   ipratropium (ATROVENT ) 0.03 % nasal spray Place 2 sprays into both nostrils every 12 (twelve) hours. 30 mL 0   levonorgestrel -ethinyl estradiol  (ALESSE) 0.1-20 MG-MCG tablet TAKE 1 TABLET BY MOUTH ONCE DAILY 84 tablet 4   lisdexamfetamine (VYVANSE ) 20 MG capsule Take 1 capsule (20 mg total) by mouth daily. 30 capsule 0   Multiple Vitamin (MULTIVITAMIN) tablet Take 1 tablet by mouth daily.     pantoprazole  (PROTONIX ) 40 MG tablet TAKE 1 TABLET BY MOUTH EVERY DAY 30 tablet 0    No current facility-administered medications for this visit.     Musculoskeletal: Strength & Muscle Tone: N/A Gait & Station: N/A Patient leans: N/A  Psychiatric Specialty Exam: Review of Systems  Psychiatric/Behavioral:  Positive for decreased concentration. Negative for agitation, behavioral problems, confusion, dysphoric mood, hallucinations, self-injury, sleep disturbance and suicidal ideas. The patient is not nervous/anxious and is not hyperactive.   All other systems reviewed and are negative.   There were no vitals taken for this visit.There is no height or weight on file to calculate BMI.  General Appearance:  Well Groomed  Eye Contact:  Good  Speech:  Clear and Coherent  Volume:  Normal  Mood:  good  Affect:  Appropriate, Congruent, and Full Range  Thought Process:  Coherent  Orientation:  Full (Time, Place, and Person)  Thought Content: Logical   Suicidal Thoughts:  No  Homicidal Thoughts:  No  Memory:  Immediate;   Good  Judgement:  Good  Insight:  Good  Psychomotor Activity:  Normal  Concentration:  Concentration: Good and Attention Span: Good  Recall:  Good  Fund of Knowledge: Good  Language: Good  Akathisia:  No  Handed:  Right  AIMS (if indicated): not done  Assets:  Communication Skills Desire for Improvement  ADL's:  Intact  Cognition: WNL  Sleep:  Good   Screenings: PHQ2-9    Flowsheet Row Office Visit from 11/13/2021 in [facility] Video Visit from 06/09/2021 in [facility] Video Visit from 04/11/2021 in [facility] Office Visit from 12/07/2020 in [facility] Nutrition from 04/01/2014 in [facility]  - A Dept Of [place]. [facility]  PHQ-2 Total Score 2 1 0 3 0  PHQ-9 Total Score 7 -- -- 9 --   Flowsheet Row Office Visit from 11/13/2021 in [facility] Video Visit from 06/09/2021 in [facility] Video Visit from 04/11/2021 in [facility]  C-SSRS RISK CATEGORY No Risk No Risk No Risk     Assessment and Plan:  Sandra Dawson is a 28 y.o. year old female with a history of OCD, anxiety, history of ADHD, who presents for follow up appointment for below.    1. Mixed obsessional thoughts and acts 2. MDD (major depressive disorder), recurrent, in partial remission (HCC) 3. Anxiety state Acute stressors include: starting a full time work Other stressors include:  previous work environment, loss of her great uncle   History:   Although she reports racing thoughts related to ADHD, her OCD symptoms are manageable, and she denies any significant depressive symptoms or anxiety.  She has good support system, including her friends and her boyfriend.  Will continue current dose of Lexapro  to target OCD, depression and anxiety.   # weight gain  She has had weight gain, and has been working on diet.  She has not been able to establish care with primary care.  She agrees to do so.   # history of ADHD She has difficulty in attention, and has not been seen by current attention specialist due to scheduling issues.  She agrees that she will ask them to send the record to us  for consolidation of care.    Plan Continue lexapro  20 mg daily  She was advised to establish care with her primary care, and check metabolic panels, CBC, TSH Next appointment- 8/11 at 2 pm, IP - on Vyvanse  20 mg daily (worsening in OCD at higher dose)   Past trials of medication: sertraline ,    The patient demonstrates the following risk factors for suicide: Chronic risk factors for suicide include: psychiatric disorder of anxiety. Acute risk factors for suicide include: N/A. Protective factors for this patient include: positive social support, responsibility to others  (children, family), coping skills and hope for the future. Considering these factors, the overall suicide risk at this point appears to be low. Patient is appropriate for outpatient follow up.  Collaboration of Care: Collaboration of Care: Other reviewed notes in Epic  Patient/Guardian was advised Release of Information must be obtained prior to any record release in order to collaborate their care with an outside provider. Patient/Guardian was advised if they have not already done so to contact the registration department to sign all necessary forms in order for us  to release information regarding their care.   Consent: Patient/Guardian gives verbal consent for treatment and assignment of benefits for services provided during this visit. Patient/Guardian expressed understanding and agreed to proceed.    Katheren Sleet, MD 02/14/2024, 8:24 AM

## 2024-02-12 ENCOUNTER — Other Ambulatory Visit: Payer: Self-pay | Admitting: Psychiatry

## 2024-02-14 ENCOUNTER — Encounter: Payer: Self-pay | Admitting: Psychiatry

## 2024-02-14 ENCOUNTER — Telehealth (INDEPENDENT_AMBULATORY_CARE_PROVIDER_SITE_OTHER): Admitting: Psychiatry

## 2024-02-14 DIAGNOSIS — F3341 Major depressive disorder, recurrent, in partial remission: Secondary | ICD-10-CM | POA: Diagnosis not present

## 2024-02-14 DIAGNOSIS — F422 Mixed obsessional thoughts and acts: Secondary | ICD-10-CM | POA: Diagnosis not present

## 2024-02-14 DIAGNOSIS — F411 Generalized anxiety disorder: Secondary | ICD-10-CM | POA: Diagnosis not present

## 2024-02-14 MED ORDER — ESCITALOPRAM OXALATE 20 MG PO TABS: 20.0000 mg | ORAL_TABLET | Freq: Every day | ORAL | 1 refills | Status: DC

## 2024-03-25 NOTE — Progress Notes (Unsigned)
 Virtual Visit via Video Note  I connected with Sandra Dawson on 03/26/24 at  8:30 AM EDT by a video enabled telemedicine application and verified that I am speaking with the correct person using two identifiers.  Location: Patient: home Provider: office Persons participated in the visit- patient, provider    I discussed the limitations of evaluation and management by telemedicine and the availability of in person appointments. The patient expressed understanding and agreed to proceed.    I discussed the assessment and treatment plan with the patient. The patient was provided an opportunity to ask questions and all were answered. The patient agreed with the plan and demonstrated an understanding of the instructions.   The patient was advised to call back or seek an in-person evaluation if the symptoms worsen or if the condition fails to improve as anticipated.   Katheren Sleet, MD    [facility] MD/PA/NP OP Progress Note  03/26/2024 9:07 AM ANESIA BLACKWELL  MRN:  [id number]  Chief Complaint:  Chief Complaint  Patient presents with   Follow-up   HPI:  This is a follow-up appointment for OCD, depression and ADHD.  The appointment is sooner based on the request from the patient.   [facility].  Trial of metadate . On vyvanse  since 2016. The most recent dose vyvanse  20 mg daily, last prescribed 06/2023 BP 122/78, HR 98 03/2023, vitals are mostly within this range. Record indicated over 500 pages (although only up to 300 pages were accessible on media tab) They are records of NPQ, ASRS. No neuropsych testing was available.  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  She feels excited.  She also wanted to make sure to get Vyvanse  as she struggles with focus.  She noticed this when she was during the interview.  She tends to zone out, and does not remember things. She has been trying to take notes.  She states that  she has been on Concerta , Vyvanse  since sixth grade.  She does remember that she was tangent on things, and could not complete homework as she was focused on other things through the night.  She had accommodation for extra time for exam when she was in collage.  She was bothered very easily when people clicking pen.  She did not need extra time most of the time as she was able to focus in a quiet room. Her grades were good, and she denies IEP otherwise.   She states that her mood has been good.  She is far more relaxed, which she apparently attributes to having a job.  She has been able to take shower up to 20 minutes, and it is more enjoyable.  Although she later had a panic attack of crying, feeling down, her boyfriend has been very helpful.  She sleeps well.  She has been mindful of her diet.  She states that her binge eating was partly due to idle hands.  She has been drinking water, and doing crafts.  She is also hoping to work on work out.  She denies SI, HI, hallucinations.  She agrees with the plans as outlined below.    Substance use  Tobacco Alcohol Other substances/  Current  Grass of wine a few months ago denies  Past  History of binge alcohol use denies  Past Treatment        Wt Readings from Last 3 Encounters:  11/13/21 164 lb 12.8 oz (  74.8 kg)  09/29/20 147 lb (66.7 kg)  07/22/19 145 lb 2 oz (65.8 kg)     Visit Diagnosis:    ICD-10-CM   1. Mixed obsessional thoughts and acts  F42.2     2. MDD (major depressive disorder), recurrent, in partial remission (HCC)  F33.41 TSH    3. Anxiety state  F41.1 TSH    4. Lipid screening  Z13.220 Lipid panel    5. High risk medication use  Z79.899 Basic metabolic panel    Monitor Drug Profile 10(MW)    6. Screening for iron deficiency anemia  Z13.0 CBC    Ferritin    7. Attention deficit hyperactivity disorder (ADHD), unspecified ADHD type  F90.9       Past Psychiatric History: Please see initial evaluation for full details. I  have reviewed the history. No updates at this time.     Past Medical History:  Past Medical History:  Diagnosis Date   Acid reflux    ADHD (attention deficit hyperactivity disorder)    History of chicken pox     Past Surgical History:  Procedure Laterality Date   MYRINGOTOMY WITH TUBE PLACEMENT     NO PAST SURGERIES      Family Psychiatric History: Please see initial evaluation for full details. I have reviewed the history. No updates at this time.     Family History:  Family History  Problem Relation Age of Onset   Hyperlipidemia Father    Diabetes Father    ADD / ADHD Sister    Depression Sister    Eating disorder Sister    Pulmonary fibrosis Maternal Grandmother    Lung cancer Maternal Grandfather    Dementia Maternal Grandfather    Bladder Cancer Paternal Grandmother    Heart disease Paternal Grandfather    Heart attack Paternal Grandfather    Healthy Mother     Social History:  Social History   Socioeconomic History   Marital status: Single    Spouse name: Not on file   Number of children: 0   Years of education: Not on file   Highest education level: Not on file  Occupational History   Occupation: Management consultant  Tobacco Use   Smoking status: Never   Smokeless tobacco: Never  Vaping Use   Vaping status: Never Used  Substance and Sexual Activity   Alcohol use: Yes    Alcohol/week: 3.0 standard drinks of alcohol    Types: 3 Cans of beer per week   Drug use: Never   Sexual activity: Yes    Birth control/protection: Pill  Other Topics Concern   Not on file  Social History Narrative   In college - Production designer, theatre/television/film regularly   Social Drivers of Health   Financial Resource Strain: Not on file  Food Insecurity: Not on file  Transportation Needs: Not on file  Physical Activity: Not on file  Stress: Not on file  Social Connections: Unknown (04/24/2023)   Received from [facility]   Social Network    Social  Network: Not on file    Allergies: No Known Allergies  Metabolic Disorder Labs: No results found for: HGBA1C, MPG No results found for: PROLACTIN Lab Results  Component Value Date   CHOL 169 01/26/2014   TRIG 73.0 01/26/2014   HDL 62.00 01/26/2014   CHOLHDL 3 01/26/2014   VLDL 14.6 01/26/2014   LDLCALC 92 01/26/2014   Lab Results  Component Value Date   TSH  2.02 01/26/2014    Therapeutic Level Labs: No results found for: LITHIUM No results found for: VALPROATE No results found for: CBMZ  Current Medications: Current Outpatient Medications  Medication Sig Dispense Refill   lisdexamfetamine (VYVANSE ) 20 MG capsule Take 1 capsule (20 mg total) by mouth daily. 30 capsule 0   [START ON 04/14/2024] escitalopram  (LEXAPRO ) 20 MG tablet Take 1 tablet (20 mg total) by mouth daily. 30 tablet 1   ipratropium (ATROVENT ) 0.03 % nasal spray Place 2 sprays into both nostrils every 12 (twelve) hours. 30 mL 0   levonorgestrel -ethinyl estradiol  (ALESSE) 0.1-20 MG-MCG tablet TAKE 1 TABLET BY MOUTH ONCE DAILY 84 tablet 4   lisdexamfetamine (VYVANSE ) 20 MG capsule Take 1 capsule (20 mg total) by mouth daily. 30 capsule 0   Multiple Vitamin (MULTIVITAMIN) tablet Take 1 tablet by mouth daily.     pantoprazole  (PROTONIX ) 40 MG tablet TAKE 1 TABLET BY MOUTH EVERY DAY 30 tablet 0   No current facility-administered medications for this visit.     Musculoskeletal: Strength & Muscle Tone: N/A Gait & Station: N/A Patient leans: N/A  Psychiatric Specialty Exam: Review of Systems  Psychiatric/Behavioral:  Positive for decreased concentration. Negative for agitation, behavioral problems, confusion, dysphoric mood, hallucinations, self-injury, sleep disturbance and suicidal ideas. The patient is nervous/anxious. The patient is not hyperactive.   All other systems reviewed and are negative.   There were no vitals taken for this visit.There is no height or weight on file to calculate BMI.   General Appearance: Well Groomed  Eye Contact:  Good  Speech:  Clear and Coherent  Volume:  Normal  Mood:  good  Affect:  Appropriate, Congruent, and Full Range  Thought Process:  Coherent  Orientation:  Full (Time, Place, and Person)  Thought Content: Logical   Suicidal Thoughts:  No  Homicidal Thoughts:  No  Memory:  Immediate;   Good  Judgement:  Good  Insight:  Good  Psychomotor Activity:  Normal  Concentration:  Concentration: Good and Attention Span: Good  Recall:  Good  Fund of Knowledge: Good  Language: Good  Akathisia:  No  Handed:  Right  AIMS (if indicated): not done  Assets:  Communication Skills Desire for Improvement  ADL's:  Intact  Cognition: WNL  Sleep:  Good   Screenings: PHQ2-9    Flowsheet Row Office Visit from 11/13/2021 in [facility] Video Visit from 06/09/2021 in [facility] Video Visit from 04/11/2021 in [facility] Office Visit from 12/07/2020 in [facility] Nutrition from 04/01/2014 in [facility]  - [facility]  PHQ-2 Total Score 2 1 0 3 0  PHQ-9 Total Score 7 -- -- 9 --   Flowsheet Row Office Visit from 11/13/2021 in [facility] Video Visit from 06/09/2021 in [facility] Video Visit from 04/11/2021 in [facility]  C-SSRS RISK CATEGORY No Risk No Risk No Risk     Assessment and Plan:  Sandra Dawson is a 28 y.o. year old female with a history of OCD, anxiety, history of ADHD, who presents for follow up appointment for below.   1. Mixed obsessional thoughts and acts 2. MDD (major depressive disorder), recurrent, in partial remission (HCC) 3. Anxiety state Other stressors include:  previous work environment, loss of her  great uncle  History:   Although she reports occasional anxiety and down mood, it has been overall improving especially in the context of starting a new job, which aligns with her degree and interest.  It is noted that she reports great support from her boyfriend as well.  Will continue current dose of Lexapro  to target OCD, depression and anxiety.   # ADHD  - she has been on stimulant since sixth grade. Tx from [facility], has been on vyvanse  20 mg daily with good benefit (higher dose caused worsening in OCD) Although this writer could not find neuropsychological examination from the record obtained from [facility], she has a childhood ADHD, and her clinical course is consistent with ADHD.  She mainly struggles with hyper focus , forgetfulness, which she reports significant concern especially given she starts a new work.  Will restart original dose of Vyvanse  given she reports significant benefit from this medication.  Discussed potential risk of worsening in anxiety, hypertension, tachycardia, insomnia, headache.  She agrees to have in person visit after her work schedule is stabilized.  She also agrees that the prescription will not be continued if any concern of misuse of this medication or substance use.  She agrees to obtain urine screening; refill will be ordered after reviewing the results.    5. High risk medication use Obtain uds  6. Screening for iron deficiency anemia 4. Lipid screening Although she did have weight gain, it has plateau since working on diet. She has not been able to establish care with primary care, and reports mild fatigue.  Will obtain lab.   Plan Continue lexapro  20 mg daily  Start Vyvanse  20 mg daily  Obtain UDS (after this is reviewed, refill of vyvanse  will be sent) Obtain labs (CBC, ferritin, TSH, lipid panel, CBP) Next appointment- 10/3 at 11 30, video - on Vyvanse  20 mg daily (worsening in OCD at higher dose)   Past  trials of medication: sertraline ,    The patient demonstrates the following risk factors for suicide: Chronic risk factors for suicide include: psychiatric disorder of anxiety. Acute risk factors for suicide include: N/A. Protective factors for this patient include: positive social support, responsibility to others (children, family), coping skills and hope for the future. Considering these factors, the overall suicide risk at this point appears to be low. Patient is appropriate for outpatient follow up.  A total of 45 minutes was spent on the following activities during the encounter date, which includes but is not limited to: preparing to see the patient (e.g., reviewing tests and records), obtaining and/or reviewing separately obtained history, performing a medically necessary examination or evaluation, counseling and educating the patient, family, or caregiver, ordering medications, tests, or procedures, referring and communicating with other healthcare professionals (when not reported separately), documenting clinical information in the electronic or paper health record, independently interpreting test or lab results and communicating these results to the family or caregiver, and coordinating care (when not reported separately).     Collaboration of Care: Collaboration of Care: Other reviewed notes in Epic, record from [facility]  Patient/Guardian was advised Release of Information must be obtained prior to any record release in order to collaborate their care with an outside provider. Patient/Guardian was advised if they have not already done so to contact the registration department to sign all necessary forms in order for us  to release information regarding their care.   Consent: Patient/Guardian gives verbal consent for treatment and assignment of benefits for services provided  during this visit. Patient/Guardian expressed understanding and agreed to proceed.    Katheren Sleet, MD 03/26/2024, 9:07 AM

## 2024-03-26 ENCOUNTER — Telehealth (INDEPENDENT_AMBULATORY_CARE_PROVIDER_SITE_OTHER): Admitting: Psychiatry

## 2024-03-26 ENCOUNTER — Encounter: Payer: Self-pay | Admitting: Psychiatry

## 2024-03-26 DIAGNOSIS — F411 Generalized anxiety disorder: Secondary | ICD-10-CM

## 2024-03-26 DIAGNOSIS — Z79899 Other long term (current) drug therapy: Secondary | ICD-10-CM

## 2024-03-26 DIAGNOSIS — F3341 Major depressive disorder, recurrent, in partial remission: Secondary | ICD-10-CM | POA: Diagnosis not present

## 2024-03-26 DIAGNOSIS — F909 Attention-deficit hyperactivity disorder, unspecified type: Secondary | ICD-10-CM

## 2024-03-26 DIAGNOSIS — F422 Mixed obsessional thoughts and acts: Secondary | ICD-10-CM

## 2024-03-26 DIAGNOSIS — Z13220 Encounter for screening for lipoid disorders: Secondary | ICD-10-CM

## 2024-03-26 DIAGNOSIS — Z130 Encounter for screening for diseases of the blood and blood-forming organs and certain disorders involving the immune mechanism: Secondary | ICD-10-CM

## 2024-03-26 MED ORDER — ESCITALOPRAM OXALATE 20 MG PO TABS: 20.0000 mg | ORAL_TABLET | Freq: Every day | ORAL | 1 refills | Status: DC

## 2024-03-26 MED ORDER — LISDEXAMFETAMINE DIMESYLATE 20 MG PO CAPS: 20.0000 mg | ORAL_CAPSULE | Freq: Every day | ORAL | 0 refills | Status: DC

## 2024-03-26 NOTE — Patient Instructions (Signed)
 Continue lexapro  20 mg daily  Start Vyvanse  20 mg daily  Obtain labs (CBC, ferritin, TSH, lipid panel, CBP), urine screening at [facility] Next appointment- 10/3 at 11 30

## 2024-03-30 ENCOUNTER — Ambulatory Visit: Admitting: Psychiatry

## 2024-03-31 ENCOUNTER — Ambulatory Visit: Payer: Self-pay | Admitting: Psychiatry

## 2024-03-31 ENCOUNTER — Other Ambulatory Visit: Payer: Self-pay | Admitting: Psychiatry

## 2024-03-31 LAB — MONITOR DRUG PROFILE 10(MW)
Amphetamine Scrn, Ur: NEGATIVE ng/mL
BARBITURATE SCREEN URINE: NEGATIVE ng/mL
BENZODIAZEPINE SCREEN, URINE: NEGATIVE ng/mL
CANNABINOIDS UR QL SCN: NEGATIVE ng/mL
Cocaine (Metab) Scrn, Ur: NEGATIVE ng/mL
Creatinine(Crt), U: 91.4 mg/dL (ref 20.0–300.0)
Methadone Screen, Urine: NEGATIVE ng/mL
OXYCODONE+OXYMORPHONE UR QL SCN: NEGATIVE ng/mL
Opiate Scrn, Ur: NEGATIVE ng/mL
Ph of Urine: 5 (ref 4.5–8.9)
Phencyclidine Qn, Ur: NEGATIVE ng/mL
Propoxyphene Scrn, Ur: NEGATIVE ng/mL

## 2024-03-31 LAB — LIPID PANEL
Chol/HDL Ratio: 2.3 ratio (ref 0.0–4.4)
Cholesterol, Total: 234 mg/dL — ABNORMAL HIGH (ref 100–199)
HDL: 102 mg/dL (ref >39)
LDL Chol Calc (NIH): 110 mg/dL — ABNORMAL HIGH (ref 0–99)
Triglycerides: 132 mg/dL (ref 0–149)
VLDL Cholesterol Cal: 22 mg/dL (ref 5–40)

## 2024-03-31 LAB — CBC
Hematocrit: 42.6 % (ref 34.0–46.6)
Hemoglobin: 13.8 g/dL (ref 11.1–15.9)
MCH: 29.7 pg (ref 26.6–33.0)
MCHC: 32.4 g/dL (ref 31.5–35.7)
MCV: 92 fL (ref 79–97)
Platelets: 319 x10E3/uL (ref 150–450)
RBC: 4.64 x10E6/uL (ref 3.77–5.28)
RDW: 12.1 % (ref 11.7–15.4)
WBC: 7.9 x10E3/uL (ref 3.4–10.8)

## 2024-03-31 LAB — FERRITIN: Ferritin: 39 ng/mL (ref 15–150)

## 2024-03-31 LAB — MED LIST OPTION NOT SELECTED

## 2024-03-31 LAB — TSH: TSH: 3.45 u[IU]/mL (ref 0.450–4.500)

## 2024-03-31 MED ORDER — LISDEXAMFETAMINE DIMESYLATE 20 MG PO CAPS: 20.0000 mg | ORAL_CAPSULE | Freq: Every day | ORAL | 0 refills | Status: DC

## 2024-03-31 NOTE — Progress Notes (Signed)
 Please contact the patient.  - Ferritin level is slightly low, although she does not have anemia. Recommend starting iron sulfate 325 mg daily (over the counter) if she experiences fatigue.  - Thyroid levels are within normal range.  - Her cholesterol and LDL levels are elevated, assuming the blood test was done fasting. Recommend regular exercise, dietary changes, and follow-up with primary care for routine monitoring.  - Urine screenings are negative, and I will send a refill of Vyvanse  as discussed.

## 2024-04-01 NOTE — Progress Notes (Signed)
 Called patient to inform of lab result no answer unable to leave a voicemail due to the mailbox full

## 2024-05-17 NOTE — Progress Notes (Unsigned)
 Virtual Visit via Video Note  I connected with Sandra Dawson on 05/22/24 at 11:30 AM EDT by a video enabled telemedicine application and verified that I am speaking with the correct person using two identifiers.  Location: Patient: home Provider: home office Persons participated in the visit- patient, provider    I discussed the limitations of evaluation and management by telemedicine and the availability of in person appointments. The patient expressed understanding and agreed to proceed.    I discussed the assessment and treatment plan with the patient. The patient was provided an opportunity to ask questions and all were answered. The patient agreed with the plan and demonstrated an understanding of the instructions.   The patient was advised to call back or seek an in-person evaluation if the symptoms worsen or if the condition fails to improve as anticipated.   Katheren Sleet, MD    [facility] MD/PA/NP OP Progress Note  05/22/2024 12:37 PM Sandra Dawson  MRN:  [id number]  Chief Complaint:  Chief Complaint  Patient presents with   Follow-up   HPI:  This is a follow-up appointment for OCD, depression and anxiety, ADHD.  She states that she has been working at the current place for 1.5 month.  It has been going very well.  She also reports positive feedback that she has been doing very well.  She reports good relationship with her boyfriend.  She likes that she now has days off on the weekends, and can make plans. She will be meeting his family.  Although she was stressed last week due to issues related to dishwasher, her mood has been stable otherwise.  She sleeps 9 hours.  She denies feeling depressed.  She denies anxiety.  She denies any concern about OCD and does not take much time when she takes a shower.  Her focus has been much better since restarting Vyvanse .  She denies any concern about the side effect.  She has a new insurance and is planning to establish care with her  primary care provider.  She was not fasting when lipid was checked, but has been trying to work on exercise.  She denies SI, HI, hallucinations.  She agrees with the plans as outlined below.   Substance use   Tobacco Alcohol Other substances/  Current   Grass of wine a few months ago denies  Past   History of binge alcohol use denies  Past Treatment             Visit Diagnosis:    ICD-10-CM   1. Mixed obsessional thoughts and acts  F42.2     2. MDD (major depressive disorder), recurrent, in partial remission  F33.41     3. Anxiety state  F41.1     4. Attention deficit hyperactivity disorder (ADHD), unspecified ADHD type  F90.9       Past Psychiatric History: Please see initial evaluation for full details. I have reviewed the history. No updates at this time.     Past Medical History:  Past Medical History:  Diagnosis Date   Acid reflux    ADHD (attention deficit hyperactivity disorder)    History of chicken pox     Past Surgical History:  Procedure Laterality Date   MYRINGOTOMY WITH TUBE PLACEMENT     NO PAST SURGERIES      Family Psychiatric History: Please see initial evaluation for full details. I have reviewed the history. No updates at this time.     Family History:  Family  History  Problem Relation Age of Onset   Hyperlipidemia Father    Diabetes Father    ADD / ADHD Sister    Depression Sister    Eating disorder Sister    Pulmonary fibrosis Maternal Grandmother    Lung cancer Maternal Grandfather    Dementia Maternal Grandfather    Bladder Cancer Paternal Grandmother    Heart disease Paternal Grandfather    Heart attack Paternal Grandfather    Healthy Mother     Social History:  Social History   Socioeconomic History   Marital status: Single    Spouse name: Not on file   Number of children: 0   Years of education: Not on file   Highest education level: Not on file  Occupational History   Occupation: Management consultant  Tobacco Use    Smoking status: Never   Smokeless tobacco: Never  Vaping Use   Vaping status: Never Used  Substance and Sexual Activity   Alcohol use: Yes    Alcohol/week: 3.0 standard drinks of alcohol    Types: 3 Cans of beer per week   Drug use: Never   Sexual activity: Yes    Birth control/protection: Pill  Other Topics Concern   Not on file  Social History Narrative   In college - Production designer, theatre/television/film regularly   Social Drivers of Health   Financial Resource Strain: Not on file  Food Insecurity: Not on file  Transportation Needs: Not on file  Physical Activity: Not on file  Stress: Not on file  Social Connections: Unknown (04/24/2023)   Received from [employer]   Social Network    Social Network: Not on file    Allergies: No Known Allergies  Metabolic Disorder Labs: No results found for: HGBA1C, MPG No results found for: PROLACTIN Lab Results  Component Value Date   CHOL 234 (H) 03/30/2024   TRIG 132 03/30/2024   HDL 102 03/30/2024   CHOLHDL 2.3 03/30/2024   VLDL 85.3 01/26/2014   LDLCALC 110 (H) 03/30/2024   LDLCALC 92 01/26/2014   Lab Results  Component Value Date   TSH 3.450 03/30/2024   TSH 2.02 01/26/2014    Therapeutic Level Labs: No results found for: LITHIUM No results found for: VALPROATE No results found for: CBMZ  Current Medications: Current Outpatient Medications  Medication Sig Dispense Refill   [START ON 08/04/2024] lisdexamfetamine (VYVANSE ) 20 MG capsule Take 1 capsule (20 mg total) by mouth daily. 30 capsule 0   [START ON 06/13/2024] escitalopram  (LEXAPRO ) 20 MG tablet Take 1 tablet (20 mg total) by mouth daily. 30 tablet 2   ipratropium (ATROVENT ) 0.03 % nasal spray Place 2 sprays into both nostrils every 12 (twelve) hours. 30 mL 0   levonorgestrel -ethinyl estradiol  (ALESSE) 0.1-20 MG-MCG tablet TAKE 1 TABLET BY MOUTH ONCE DAILY 84 tablet 4   [START ON 06/05/2024] lisdexamfetamine (VYVANSE ) 20 MG capsule Take 1  capsule (20 mg total) by mouth daily. 30 capsule 0   [START ON 07/05/2024] lisdexamfetamine (VYVANSE ) 20 MG capsule Take 1 capsule (20 mg total) by mouth daily. 30 capsule 0   Multiple Vitamin (MULTIVITAMIN) tablet Take 1 tablet by mouth daily.     pantoprazole  (PROTONIX ) 40 MG tablet TAKE 1 TABLET BY MOUTH EVERY DAY 30 tablet 0   No current facility-administered medications for this visit.     Musculoskeletal: Strength & Muscle Tone: N/A Gait & Station: N/A Patient leans: N/A  Psychiatric Specialty Exam: Review of Systems  Psychiatric/Behavioral: Negative.    All other systems reviewed and are negative.   There were no vitals taken for this visit.There is no height or weight on file to calculate BMI.  General Appearance: Well Groomed  Eye Contact:  Good  Speech:  Clear and Coherent  Volume:  Normal  Mood:  good  Affect:  Appropriate, Congruent, and Full Range  Thought Process:  Coherent  Orientation:  Full (Time, Place, and Person)  Thought Content: Logical   Suicidal Thoughts:  No  Homicidal Thoughts:  No  Memory:  Immediate;   Good  Judgement:  Good  Insight:  Good  Psychomotor Activity:  Normal  Concentration:  Concentration: Good and Attention Span: Good  Recall:  Good  Fund of Knowledge: Good  Language: Good  Akathisia:  No  Handed:  Right  AIMS (if indicated): not done  Assets:  Communication Skills Desire for Improvement  ADL's:  Intact  Cognition: WNL  Sleep:  Good   Screenings: PHQ2-9    Flowsheet Row Office Visit from 11/13/2021 in [facility] Video Visit from 06/09/2021 in [facility] Video Visit from 04/11/2021 in [facility] Office Visit from 12/07/2020 in [facility] Nutrition from 04/01/2014 in [facility]  - A Dept Of [place]  PHQ-2 Total Score 2 1 0 3 0   PHQ-9 Total Score 7 -- -- 9 --   Flowsheet Row Office Visit from 11/13/2021 in [facility] Video Visit from 06/09/2021 in [facility] Video Visit from 04/11/2021 in [facility]  C-SSRS RISK CATEGORY No Risk No Risk No Risk     Assessment and Plan:  Sandra Dawson is a 28 y.o. year old female with a history of OCD, anxiety, history of ADHD, who presents for follow up appointment for below.   1. Mixed obsessional thoughts and acts 2. MDD (major depressive disorder), recurrent, in partial remission 3. Anxiety state Other stressors include:  previous work environment, loss of her great uncle   History:   She denies any significant mood symptoms since the last visit.  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  Will continue current dose of Lexapro  to target OCD, depression and anxiety.   4. Attention deficit hyperactivity disorder (ADHD), unspecified ADHD type - she has been on stimulant since sixth grade. Tx from [place] attention specialist (no documentation about neuropsych testing), has been on vyvanse  20 mg daily with good benefit (higher dose caused worsening in OCD)  She reports significant benefit in inattention since restarting Vyvanse .  She denies any adverse reaction.  Will continue current dose of Vyvanse  to target ADHD.    5. High risk medication use - UDS negative 03/2024    Last checked  EKG    Lipid panels LDL 110 H, Chol 234 H (not fasting) 05/2024  HbA1c       Plan Continue lexapro  20 mg daily  Continue Vyvanse  20 mg daily  Next appointment- 1/2 at 11 30, video   Past trials of medication: sertraline ,    The patient demonstrates the following risk factors for suicide: Chronic risk factors for suicide include: psychiatric disorder of anxiety. Acute risk factors for suicide include: N/A. Protective  factors for this patient include: positive social support, responsibility to others (children, family), coping skills  and hope for the future. Considering these factors, the overall suicide risk at this point appears to be low. Patient is appropriate for outpatient follow up.  Collaboration of Care: Collaboration of Care: Other reviewed notes in Epic  Patient/Guardian was advised Release of Information must be obtained prior to any record release in order to collaborate their care with an outside provider. Patient/Guardian was advised if they have not already done so to contact the registration department to sign all necessary forms in order for us  to release information regarding their care.   Consent: Patient/Guardian gives verbal consent for treatment and assignment of benefits for services provided during this visit. Patient/Guardian expressed understanding and agreed to proceed.    Katheren Sleet, MD 05/22/2024, 12:37 PM

## 2024-05-22 ENCOUNTER — Telehealth (INDEPENDENT_AMBULATORY_CARE_PROVIDER_SITE_OTHER): Admitting: Psychiatry

## 2024-05-22 ENCOUNTER — Encounter: Payer: Self-pay | Admitting: Psychiatry

## 2024-05-22 DIAGNOSIS — F422 Mixed obsessional thoughts and acts: Secondary | ICD-10-CM | POA: Diagnosis not present

## 2024-05-22 DIAGNOSIS — F411 Generalized anxiety disorder: Secondary | ICD-10-CM

## 2024-05-22 DIAGNOSIS — F909 Attention-deficit hyperactivity disorder, unspecified type: Secondary | ICD-10-CM | POA: Diagnosis not present

## 2024-05-22 DIAGNOSIS — F3341 Major depressive disorder, recurrent, in partial remission: Secondary | ICD-10-CM

## 2024-05-22 MED ORDER — LISDEXAMFETAMINE DIMESYLATE 20 MG PO CAPS: 20.0000 mg | ORAL_CAPSULE | Freq: Every day | ORAL | 0 refills | Status: AC

## 2024-05-22 MED ORDER — ESCITALOPRAM OXALATE 20 MG PO TABS: 20.0000 mg | ORAL_TABLET | Freq: Every day | ORAL | 2 refills | Status: DC

## 2024-05-22 NOTE — Patient Instructions (Signed)
 Continue lexapro  20 mg daily  Continue Vyvanse  20 mg daily  Next appointment- 1/2 at 11 30

## 2024-08-15 NOTE — Progress Notes (Unsigned)
 Virtual Visit via Video Note  I connected with Sandra Dawson on 08/21/2024 at 11:30 AM EST by a video enabled telemedicine application and verified that I am speaking with the correct person using two identifiers.  Location: Patient: home Provider: home office Persons participated in the visit- patient, provider    I discussed the limitations of evaluation and management by telemedicine and the availability of in person appointments. The patient expressed understanding and agreed to proceed.   I discussed the assessment and treatment plan with the patient. The patient was provided an opportunity to ask questions and all were answered. The patient agreed with the plan and demonstrated an understanding of the instructions.   The patient was advised to call back or seek an in-person evaluation if the symptoms worsen or if the condition fails to improve as anticipated.   Katheren Sleet, MD    [facility] MD/PA/NP OP Progress Note  08/21/2024 12:00 PM Sandra Dawson  MRN:  [id number]  Chief Complaint:  Chief Complaint  Patient presents with   Follow-up   HPI:  This is a follow-up appointment for OCD, depression and ADHD.  She states that she has been doing good.  She moved in with her boyfriend 1.5 month ago.  She reports a good relationship with him.  The work has been going well and she had a positive feedback.  She has been feeling a little anxious, which she attributes to this move and work related stress.  She uses THC Gummies to feel more relaxed.  She had palpitation with anxiety yesterday morning without any triggers.  She also reports fluctuation in her mood when she did not take oral contraceptives.  She has fair sleep.  She denies concern about OCD symptoms and she has been able to take shower and 15 minutes.  She denies SI, hallucinations.  She denies SI.  She does not see the need to start BuSpar at this time as she believes her mood is situational.  She would like to look up about  this medication and THC.   Substance use   Tobacco Alcohol Other substances/  Current   Grass of wine a few months ago THC gummies for anxiety, a few times per week  denies caffeine  Past   History of binge alcohol use denies  Past Treatment             Visit Diagnosis:    ICD-10-CM   1. Mixed obsessional thoughts and acts  F42.2     2. MDD (major depressive disorder), recurrent, in partial remission  F33.41     3. Anxiety state  F41.1     4. Attention deficit hyperactivity disorder (ADHD), unspecified ADHD type  F90.9       Past Psychiatric History: Please see initial evaluation for full details. I have reviewed the history. No updates at this time.     Past Medical History:  Past Medical History:  Diagnosis Date   Acid reflux    ADHD (attention deficit hyperactivity disorder)    History of chicken pox     Past Surgical History:  Procedure Laterality Date   MYRINGOTOMY WITH TUBE PLACEMENT     NO PAST SURGERIES      Family Psychiatric History: Please see initial evaluation for full details. I have reviewed the history. No updates at this time.     Family History:  Family History  Problem Relation Age of Onset   Hyperlipidemia Father    Diabetes Father  ADD / ADHD Sister    Depression Sister    Eating disorder Sister    Pulmonary fibrosis Maternal Grandmother    Lung cancer Maternal Grandfather    Dementia Maternal Grandfather    Bladder Cancer Paternal Grandmother    Heart disease Paternal Grandfather    Heart attack Paternal Grandfather    Healthy Mother     Social History:  Social History   Socioeconomic History   Marital status: Single    Spouse name: Not on file   Number of children: 0   Years of education: Not on file   Highest education level: Not on file  Occupational History   Occupation: Management consultant  Tobacco Use   Smoking status: Never   Smokeless tobacco: Never  Vaping Use   Vaping status: Never Used  Substance and  Sexual Activity   Alcohol use: Yes    Alcohol/week: 3.0 standard drinks of alcohol    Types: 3 Cans of beer per week   Drug use: Never   Sexual activity: Yes    Birth control/protection: Pill  Other Topics Concern   Not on file  Social History Narrative   In college - market researcher      Exercises regularly   Social Drivers of Health   Tobacco Use: Low Risk (08/21/2024)   Patient History    Smoking Tobacco Use: Never    Smokeless Tobacco Use: Never    Passive Exposure: Not on file  Financial Resource Strain: Not on file  Food Insecurity: Not on file  Transportation Needs: Not on file  Physical Activity: Not on file  Stress: Not on file  Social Connections: Not on file  Depression (EYV7-0): Medium Risk (11/13/2021)   Depression (PHQ2-9)    PHQ-2 Score: 7  Alcohol Screen: Not on file  Housing: Not on file  Utilities: Not on file  Health Literacy: Not on file    Allergies: Allergies[1]  Metabolic Disorder Labs: No results found for: HGBA1C, MPG No results found for: PROLACTIN Lab Results  Component Value Date   CHOL 234 (H) 03/30/2024   TRIG 132 03/30/2024   HDL 102 03/30/2024   CHOLHDL 2.3 03/30/2024   VLDL 85.3 01/26/2014   LDLCALC 110 (H) 03/30/2024   LDLCALC 92 01/26/2014   Lab Results  Component Value Date   TSH 3.450 03/30/2024   TSH 2.02 01/26/2014    Therapeutic Level Labs: No results found for: LITHIUM No results found for: VALPROATE No results found for: CBMZ  Current Medications: Current Outpatient Medications  Medication Sig Dispense Refill   [START ON 09/11/2024] escitalopram  (LEXAPRO ) 20 MG tablet Take 1 tablet (20 mg total) by mouth daily. 30 tablet 2   ipratropium (ATROVENT ) 0.03 % nasal spray Place 2 sprays into both nostrils every 12 (twelve) hours. 30 mL 0   levonorgestrel -ethinyl estradiol  (ALESSE) 0.1-20 MG-MCG tablet TAKE 1 TABLET BY MOUTH ONCE DAILY 84 tablet 4   lisdexamfetamine  (VYVANSE ) 20 MG capsule Take 1  capsule (20 mg total) by mouth daily. 30 capsule 0   lisdexamfetamine  (VYVANSE ) 20 MG capsule Take 1 capsule (20 mg total) by mouth daily. 30 capsule 0   lisdexamfetamine  (VYVANSE ) 20 MG capsule Take 1 capsule (20 mg total) by mouth daily. 30 capsule 0   Multiple Vitamin (MULTIVITAMIN) tablet Take 1 tablet by mouth daily.     pantoprazole  (PROTONIX ) 40 MG tablet TAKE 1 TABLET BY MOUTH EVERY DAY 30 tablet 0   No current facility-administered medications for this visit.  Musculoskeletal: Strength & Muscle Tone: N/A Gait & Station: N/A Patient leans: N/A  Psychiatric Specialty Exam: Review of Systems  Psychiatric/Behavioral:  Negative for agitation, behavioral problems, confusion, decreased concentration, dysphoric mood, hallucinations, self-injury, sleep disturbance and suicidal ideas. The patient is nervous/anxious. The patient is not hyperactive.   All other systems reviewed and are negative.   There were no vitals taken for this visit.There is no height or weight on file to calculate BMI.  General Appearance: Well Groomed  Eye Contact:  Good  Speech:  Clear and Coherent  Volume:  Normal  Mood:  Anxious  Affect:  Appropriate, Congruent, and calm  Thought Process:  Coherent  Orientation:  Full (Time, Place, and Person)  Thought Content: Logical   Suicidal Thoughts:  No  Homicidal Thoughts:  No  Memory:  Immediate;   Good  Judgement:  Good  Insight:  Good  Psychomotor Activity:  Normal  Concentration:  Concentration: Good and Attention Span: Good  Recall:  Good  Fund of Knowledge: Good  Language: Good  Akathisia:  No  Handed:  Right  AIMS (if indicated): not done  Assets:  Communication Skills Desire for Improvement  ADL's:  Intact  Cognition: WNL  Sleep:  Fair   Screenings: Equities Trader Office Visit from 11/13/2021 in [facility] Video Visit from 06/09/2021 in [facility]  Video Visit from 04/11/2021 in [facility] Office Visit from 12/07/2020 in [facility] Nutrition from 04/01/2014 in [facility]  - A Dept Of [place]  PHQ-2 Total Score 2 1 0 3 0  PHQ-9 Total Score 7 -- -- 9 --   Flowsheet Row Office Visit from 11/13/2021 in [facility] Video Visit from 06/09/2021 in [facility] Video Visit from 04/11/2021 in [facility]  C-SSRS RISK CATEGORY No Risk No Risk No Risk     Assessment and Plan:  NAFISA OLDS is a 28 y.o.female with a history of OCD, anxiety, history of ADHD, who presents for follow up appointment for below.    1. Mixed obsessional thoughts and acts 2. MDD (major depressive disorder), recurrent, in partial remission 3. Anxiety state Other stressors include:  previous work environment, loss of her great uncle   History:   She reports slight worsening in anxiety along wit panic attack, which she attributes to situational stress related to the recent relocation and work.  Although it was discussed to consider starting BuSpar, she prefers to stay on the current medication regimen, although she will look up this medication.  Will continue current dose of Lexapro  to target OCD, depression and anxiety.   4. Attention deficit hyperactivity disorder (ADHD), unspecified ADHD type - she has been on stimulant since sixth grade. Tx from [place] attention [facility] (no documentation about neuropsych testing), has been on vyvanse  20 mg daily with good benefit (higher dose caused worsening in OCD)   She reports significant benefit from Vyvanse .  Will continue current dose to target ADHD.   # THC use She started to use THC a few times per week for anxiety.  Psychoeducation was provided regarding the possible long-term effect  on her mood and attention.    5. High risk medication use - UDS negative 03/2024       Last checked  EKG  Lipid panels LDL 110 H, Chol 234 H (not fasting) 05/2024  HbA1c          Plan Continue lexapro  20 mg daily  Consider starting buspar Continue Vyvanse  20 mg daily - refill left.  Next appointment- 2/13 at 11 30, video   Past trials of medication: sertraline ,    The patient demonstrates the following risk factors for suicide: Chronic risk factors for suicide include: psychiatric disorder of anxiety. Acute risk factors for suicide include: N/A. Protective factors for this patient include: positive social support, responsibility to others (children, family), coping skills and hope for the future. Considering these factors, the overall suicide risk at this point appears to be low. Patient is appropriate for outpatient follow up.    Collaboration of Care: Collaboration of Care: Other reviewed notes in Epic  Patient/Guardian was advised Release of Information must be obtained prior to any record release in order to collaborate their care with an outside provider. Patient/Guardian was advised if they have not already done so to contact the registration department to sign all necessary forms in order for us  to release information regarding their care.   Consent: Patient/Guardian gives verbal consent for treatment and assignment of benefits for services provided during this visit. Patient/Guardian expressed understanding and agreed to proceed.    Katheren Sleet, MD 08/21/2024, 12:00 PM     [1] No Known Allergies

## 2024-08-21 ENCOUNTER — Telehealth: Admitting: Psychiatry

## 2024-08-21 ENCOUNTER — Encounter: Payer: Self-pay | Admitting: Psychiatry

## 2024-08-21 DIAGNOSIS — F411 Generalized anxiety disorder: Secondary | ICD-10-CM | POA: Diagnosis not present

## 2024-08-21 DIAGNOSIS — F422 Mixed obsessional thoughts and acts: Secondary | ICD-10-CM

## 2024-08-21 DIAGNOSIS — F3341 Major depressive disorder, recurrent, in partial remission: Secondary | ICD-10-CM | POA: Diagnosis not present

## 2024-08-21 DIAGNOSIS — F909 Attention-deficit hyperactivity disorder, unspecified type: Secondary | ICD-10-CM

## 2024-08-21 MED ORDER — ESCITALOPRAM OXALATE 20 MG PO TABS: 20.0000 mg | ORAL_TABLET | Freq: Every day | ORAL | 2 refills | Status: AC

## 2024-08-21 NOTE — Patient Instructions (Signed)
 Continue lexapro  20 mg daily  Consider starting Buspar Continue Vyvanse  20 mg daily  Next appointment- 2/13 at 11 30

## 2024-10-02 ENCOUNTER — Telehealth: Admitting: Psychiatry
